# Patient Record
Sex: Female | Born: 1945
Health system: Southern US, Community
[De-identification: ages and names within clinical notes are randomized; demographics above are authoritative.]

## PROBLEM LIST (undated history)

## (undated) DIAGNOSIS — F32A Depression, unspecified: Secondary | ICD-10-CM

## (undated) DIAGNOSIS — D693 Immune thrombocytopenic purpura: Principal | ICD-10-CM

## (undated) DIAGNOSIS — I6782 Cerebral ischemia: Secondary | ICD-10-CM

## (undated) DIAGNOSIS — K219 Gastro-esophageal reflux disease without esophagitis: Secondary | ICD-10-CM

## (undated) DIAGNOSIS — M069 Rheumatoid arthritis, unspecified: Secondary | ICD-10-CM

## (undated) DIAGNOSIS — F329 Major depressive disorder, single episode, unspecified: Secondary | ICD-10-CM

## (undated) DIAGNOSIS — G4700 Insomnia, unspecified: Secondary | ICD-10-CM

## (undated) DIAGNOSIS — M1990 Unspecified osteoarthritis, unspecified site: Secondary | ICD-10-CM

## (undated) DIAGNOSIS — Z8489 Family history of other specified conditions: Secondary | ICD-10-CM

## (undated) DIAGNOSIS — R112 Nausea with vomiting, unspecified: Secondary | ICD-10-CM

## (undated) DIAGNOSIS — T8859XA Other complications of anesthesia, initial encounter: Secondary | ICD-10-CM

## (undated) DIAGNOSIS — D759 Disease of blood and blood-forming organs, unspecified: Secondary | ICD-10-CM

## (undated) DIAGNOSIS — M810 Age-related osteoporosis without current pathological fracture: Secondary | ICD-10-CM

## (undated) DIAGNOSIS — C801 Malignant (primary) neoplasm, unspecified: Secondary | ICD-10-CM

## (undated) DIAGNOSIS — Z9889 Other specified postprocedural states: Secondary | ICD-10-CM

## (undated) DIAGNOSIS — F419 Anxiety disorder, unspecified: Secondary | ICD-10-CM

## (undated) DIAGNOSIS — D696 Thrombocytopenia, unspecified: Secondary | ICD-10-CM

## (undated) HISTORY — PX: MOHS SURGERY: SUR867

## (undated) HISTORY — DX: Rheumatoid arthritis, unspecified: M06.9

## (undated) HISTORY — DX: Depression, unspecified: F32.A

## (undated) HISTORY — DX: Insomnia, unspecified: G47.00

## (undated) HISTORY — PX: BREAST SURGERY: SHX581

## (undated) HISTORY — PX: CHOLECYSTECTOMY: SHX55

## (undated) HISTORY — DX: Age-related osteoporosis without current pathological fracture: M81.0

## (undated) HISTORY — PX: TUBAL LIGATION: SHX77

## (undated) HISTORY — DX: Major depressive disorder, single episode, unspecified: F32.9

## (undated) HISTORY — DX: Unspecified osteoarthritis, unspecified site: M19.90

## (undated) HISTORY — PX: ABDOMINAL HYSTERECTOMY: SHX81

## (undated) HISTORY — DX: Immune thrombocytopenic purpura: D69.3

---

## 1999-04-24 ENCOUNTER — Ambulatory Visit (HOSPITAL_COMMUNITY): Admission: RE | Admit: 1999-04-24 | Discharge: 1999-04-24 | Payer: Self-pay | Admitting: Gastroenterology

## 2004-02-28 ENCOUNTER — Encounter: Admission: RE | Admit: 2004-02-28 | Discharge: 2004-02-28 | Payer: Self-pay | Admitting: Orthopedic Surgery

## 2004-11-06 ENCOUNTER — Ambulatory Visit: Payer: Self-pay | Admitting: Family Medicine

## 2005-01-13 ENCOUNTER — Ambulatory Visit: Payer: Self-pay | Admitting: Hematology & Oncology

## 2005-01-23 ENCOUNTER — Ambulatory Visit: Payer: Self-pay | Admitting: Family Medicine

## 2005-04-22 ENCOUNTER — Ambulatory Visit: Payer: Self-pay | Admitting: Family Medicine

## 2006-01-12 ENCOUNTER — Ambulatory Visit: Payer: Self-pay | Admitting: Hematology & Oncology

## 2006-03-13 ENCOUNTER — Encounter: Admission: RE | Admit: 2006-03-13 | Discharge: 2006-03-13 | Payer: Self-pay | Admitting: Rheumatology

## 2007-01-06 ENCOUNTER — Ambulatory Visit: Payer: Self-pay | Admitting: Hematology & Oncology

## 2007-01-08 LAB — CBC WITH DIFFERENTIAL/PLATELET
BASO%: 0.4 % (ref 0.0–2.0)
Basophils Absolute: 0 10*3/uL (ref 0.0–0.1)
EOS%: 1.1 % (ref 0.0–7.0)
Eosinophils Absolute: 0.1 10*3/uL (ref 0.0–0.5)
HCT: 42.6 % (ref 34.8–46.6)
HGB: 15.1 g/dL (ref 11.6–15.9)
LYMPH%: 27.1 % (ref 14.0–48.0)
MCH: 33.4 pg (ref 26.0–34.0)
MCHC: 35.6 g/dL (ref 32.0–36.0)
MCV: 93.9 fL (ref 81.0–101.0)
MONO#: 0.5 10*3/uL (ref 0.1–0.9)
MONO%: 7.7 % (ref 0.0–13.0)
NEUT#: 4.2 10*3/uL (ref 1.5–6.5)
NEUT%: 63.7 % (ref 39.6–76.8)
Platelets: 136 10*3/uL — ABNORMAL LOW (ref 145–400)
RBC: 4.54 10*6/uL (ref 3.70–5.32)
RDW: 12.1 % (ref 11.3–14.5)
WBC: 6.7 10*3/uL (ref 3.9–10.0)
lymph#: 1.8 10*3/uL (ref 0.9–3.3)

## 2007-01-08 LAB — CHCC SMEAR

## 2008-01-06 ENCOUNTER — Ambulatory Visit: Payer: Self-pay | Admitting: Hematology & Oncology

## 2008-02-02 LAB — CBC WITH DIFFERENTIAL (CANCER CENTER ONLY)
BASO#: 0 10*3/uL (ref 0.0–0.2)
BASO%: 0.4 % (ref 0.0–2.0)
EOS%: 1.8 % (ref 0.0–7.0)
Eosinophils Absolute: 0.1 10*3/uL (ref 0.0–0.5)
HCT: 42 % (ref 34.8–46.6)
HGB: 14.5 g/dL (ref 11.6–15.9)
LYMPH#: 1.2 10*3/uL (ref 0.9–3.3)
LYMPH%: 18.6 % (ref 14.0–48.0)
MCH: 33.9 pg (ref 26.0–34.0)
MCHC: 34.4 g/dL (ref 32.0–36.0)
MCV: 98 fL (ref 81–101)
MONO#: 0.5 10*3/uL (ref 0.1–0.9)
MONO%: 7.2 % (ref 0.0–13.0)
NEUT#: 4.8 10*3/uL (ref 1.5–6.5)
NEUT%: 72 % (ref 39.6–80.0)
Platelets: 140 10*3/uL — ABNORMAL LOW (ref 145–400)
RBC: 4.27 10*6/uL (ref 3.70–5.32)
RDW: 11.5 % (ref 10.5–14.6)
WBC: 6.7 10*3/uL (ref 3.9–10.0)

## 2008-02-02 LAB — CHCC SATELLITE - SMEAR

## 2009-01-30 ENCOUNTER — Ambulatory Visit: Payer: Self-pay | Admitting: Hematology & Oncology

## 2009-01-31 LAB — CBC WITH DIFFERENTIAL (CANCER CENTER ONLY)
BASO#: 0 10*3/uL (ref 0.0–0.2)
BASO%: 0.4 % (ref 0.0–2.0)
EOS%: 2 % (ref 0.0–7.0)
Eosinophils Absolute: 0.1 10*3/uL (ref 0.0–0.5)
HCT: 43.1 % (ref 34.8–46.6)
HGB: 15 g/dL (ref 11.6–15.9)
LYMPH#: 1.2 10*3/uL (ref 0.9–3.3)
LYMPH%: 23.9 % (ref 14.0–48.0)
MCH: 34.1 pg — ABNORMAL HIGH (ref 26.0–34.0)
MCHC: 34.9 g/dL (ref 32.0–36.0)
MCV: 98 fL (ref 81–101)
MONO#: 0.4 10*3/uL (ref 0.1–0.9)
MONO%: 6.7 % (ref 0.0–13.0)
NEUT#: 3.5 10*3/uL (ref 1.5–6.5)
NEUT%: 67 % (ref 39.6–80.0)
Platelets: 122 10*3/uL — ABNORMAL LOW (ref 145–400)
RBC: 4.41 10*6/uL (ref 3.70–5.32)
RDW: 11 % (ref 10.5–14.6)
WBC: 5.2 10*3/uL (ref 3.9–10.0)

## 2009-01-31 LAB — CHCC SATELLITE - SMEAR

## 2010-01-25 ENCOUNTER — Ambulatory Visit: Payer: Self-pay | Admitting: Hematology & Oncology

## 2010-01-30 LAB — CBC WITH DIFFERENTIAL (CANCER CENTER ONLY)
BASO#: 0 10*3/uL (ref 0.0–0.2)
BASO%: 0.4 % (ref 0.0–2.0)
EOS%: 2.1 % (ref 0.0–7.0)
Eosinophils Absolute: 0.1 10*3/uL (ref 0.0–0.5)
HCT: 42.1 % (ref 34.8–46.6)
HGB: 14.3 g/dL (ref 11.6–15.9)
LYMPH#: 1 10*3/uL (ref 0.9–3.3)
LYMPH%: 20.3 % (ref 14.0–48.0)
MCH: 33.7 pg (ref 26.0–34.0)
MCHC: 33.9 g/dL (ref 32.0–36.0)
MCV: 99 fL (ref 81–101)
MONO#: 0.5 10*3/uL (ref 0.1–0.9)
MONO%: 9.9 % (ref 0.0–13.0)
NEUT#: 3.2 10*3/uL (ref 1.5–6.5)
NEUT%: 67.3 % (ref 39.6–80.0)
Platelets: 143 10*3/uL — ABNORMAL LOW (ref 145–400)
RBC: 4.24 10*6/uL (ref 3.70–5.32)
RDW: 10.3 % — ABNORMAL LOW (ref 10.5–14.6)
WBC: 4.8 10*3/uL (ref 3.9–10.0)

## 2010-01-30 LAB — CHCC SATELLITE - SMEAR

## 2010-01-31 LAB — VITAMIN D 25 HYDROXY (VIT D DEFICIENCY, FRACTURES): Vit D, 25-Hydroxy: 38 ng/mL (ref 30–89)

## 2011-01-07 ENCOUNTER — Encounter: Payer: Self-pay | Admitting: *Deleted

## 2011-02-18 ENCOUNTER — Encounter: Payer: Self-pay | Admitting: Hematology & Oncology

## 2011-02-24 ENCOUNTER — Other Ambulatory Visit: Payer: Self-pay | Admitting: Hematology & Oncology

## 2011-02-24 ENCOUNTER — Other Ambulatory Visit (HOSPITAL_BASED_OUTPATIENT_CLINIC_OR_DEPARTMENT_OTHER): Payer: 59 | Admitting: Lab

## 2011-02-24 ENCOUNTER — Ambulatory Visit (HOSPITAL_BASED_OUTPATIENT_CLINIC_OR_DEPARTMENT_OTHER): Payer: 59 | Admitting: Hematology & Oncology

## 2011-02-24 ENCOUNTER — Encounter: Payer: Self-pay | Admitting: Hematology & Oncology

## 2011-02-24 VITALS — BP 128/81 | HR 63 | Temp 98.1°F | Ht 62.25 in | Wt 153.0 lb

## 2011-02-24 DIAGNOSIS — D649 Anemia, unspecified: Secondary | ICD-10-CM

## 2011-02-24 DIAGNOSIS — D693 Immune thrombocytopenic purpura: Secondary | ICD-10-CM

## 2011-02-24 HISTORY — DX: Immune thrombocytopenic purpura: D69.3

## 2011-02-24 LAB — CBC WITH DIFFERENTIAL (CANCER CENTER ONLY)
BASO#: 0 10*3/uL (ref 0.0–0.2)
BASO%: 0.2 % (ref 0.0–2.0)
EOS%: 1.3 % (ref 0.0–7.0)
Eosinophils Absolute: 0.1 10*3/uL (ref 0.0–0.5)
HCT: 41.8 % (ref 34.8–46.6)
HGB: 14.8 g/dL (ref 11.6–15.9)
LYMPH%: 26.5 % (ref 14.0–48.0)
MCH: 33.6 pg (ref 26.0–34.0)
MCHC: 35.4 g/dL (ref 32.0–36.0)
MCV: 95 fL (ref 81–101)
MONO#: 0.5 10*3/uL (ref 0.1–0.9)
MONO%: 9.7 % (ref 0.0–13.0)
NEUT#: 3.3 10*3/uL (ref 1.5–6.5)
NEUT%: 62.3 % (ref 39.6–80.0)
Platelets: 125 10*3/uL — ABNORMAL LOW (ref 145–400)
RBC: 4.41 10*6/uL (ref 3.70–5.32)
RDW: 12.7 % (ref 11.1–15.7)

## 2011-02-24 LAB — RETICULOCYTES (CHCC)
RBC.: 4.43 MIL/uL (ref 3.87–5.11)
Retic Ct Pct: 1.2 % (ref 0.4–2.3)

## 2011-02-24 NOTE — Progress Notes (Signed)
This office note has been dictated.

## 2011-02-25 NOTE — Progress Notes (Signed)
CC:   Gilford Rile. Benedetto Goad, M.D.  DIAGNOSIS:  Chronic immune thrombocytopenia.  CURRENT THERAPY:  Observation.  INTERIM HISTORY:  Joy Fernandez comes in for followup.  We see her yearly. She is doing okay.  Her rheumatoid arthritis has not been too bad for her.  She has been on methotrexate for this.  She has also been on, I think she is taking Humira for this.  She is still working.  She may be thinking about retiring next year.  She has had no bruising or bleeding.  She has had no change in bowel or bladder habits.  She has had no cough or shortness of breath.  Her last mammogram was earlier this year.  PHYSICAL EXAMINATION:  This is a well developed, well-nourished white female in no obvious distress.  Vital signs:  Temperature 98, pulse 63, respiratory rate 20, blood pressure 128/81.  Weight is 153.  Head and neck:  Shows a normocephalic, atraumatic skull.  There are no ocular or oral lesions.  There are no palpable cervical or supraclavicular lymph nodes.  Lungs:  Clear bilaterally.  Cardiac:  Regular rate and rhythm with a normal S1, S2.  There are no murmurs or rubs, or bruits. Abdomen:  Soft with good bowel sounds.  No palpable abdominal mass.  No palpable hepatosplenomegaly.  Extremities:  No clubbing, cyanosis or edema.  She does have some rheumatoid arthritic changes in her joints. Skin:  No rashes, ecchymosis or petechia.  Neurologic:  No focal neurological deficits.  LABORATORY STUDIES:  White cell count is 5.3, hemoglobin 14.8, hematocrit 41.8, platelet count 125.  MCV is 95.  IMPRESSION:  Joy Fernandez is a 65 year old white female with thrombocytopenia.  We have been following her now for several years. Her platelet count tends to fluctuate.  It is not low enough that it would cause her any issues.  It is not low enough that it would cause her to change her medications.  Her quality of life seems to be doing pretty well with rheumatoid arthritis protocol.  For now, we  will plan to get her back in another year.  I do not see that we need any blood work in between visits.   ______________________________ Josph Macho, M.D. PRE/MEDQ  D:  02/24/2011  T:  02/25/2011  Job:  [id number]

## 2011-06-06 ENCOUNTER — Other Ambulatory Visit: Payer: Self-pay

## 2011-06-06 ENCOUNTER — Telehealth: Payer: Self-pay | Admitting: Hematology & Oncology

## 2011-06-06 NOTE — Telephone Encounter (Signed)
Left pt message with 4-11 appointment from referral

## 2011-07-10 ENCOUNTER — Ambulatory Visit (HOSPITAL_BASED_OUTPATIENT_CLINIC_OR_DEPARTMENT_OTHER): Payer: 59 | Admitting: Hematology & Oncology

## 2011-07-10 ENCOUNTER — Other Ambulatory Visit (HOSPITAL_BASED_OUTPATIENT_CLINIC_OR_DEPARTMENT_OTHER): Payer: 59 | Admitting: Lab

## 2011-07-10 VITALS — BP 124/73 | HR 61 | Temp 97.0°F | Ht 62.0 in | Wt 153.0 lb

## 2011-07-10 DIAGNOSIS — M069 Rheumatoid arthritis, unspecified: Secondary | ICD-10-CM

## 2011-07-10 DIAGNOSIS — D693 Immune thrombocytopenic purpura: Secondary | ICD-10-CM

## 2011-07-10 LAB — CBC WITH DIFFERENTIAL (CANCER CENTER ONLY)
BASO#: 0 10*3/uL (ref 0.0–0.2)
BASO%: 0.2 % (ref 0.0–2.0)
EOS%: 1.3 % (ref 0.0–7.0)
Eosinophils Absolute: 0.1 10*3/uL (ref 0.0–0.5)
HGB: 14.9 g/dL (ref 11.6–15.9)
LYMPH%: 25.3 % (ref 14.0–48.0)
MCH: 34.1 pg — ABNORMAL HIGH (ref 26.0–34.0)
MCHC: 35.2 g/dL (ref 32.0–36.0)
MCV: 97 fL (ref 81–101)
MONO%: 9.3 % (ref 0.0–13.0)
Platelets: 123 10*3/uL — ABNORMAL LOW (ref 145–400)
RBC: 4.37 10*6/uL (ref 3.70–5.32)
RDW: 12.9 % (ref 11.1–15.7)

## 2011-07-10 NOTE — Progress Notes (Signed)
This office note has been dictated.

## 2011-07-11 NOTE — Progress Notes (Signed)
CC:   Gilford Rile. Benedetto Goad, M.D.  DIAGNOSIS:  Chronic immune thrombocytopenia, stable.  CURRENT THERAPY:  Observation.  INTERIM HISTORY:  Ms. Danielsen comes in for her followup.  We just see her yearly.  Apparently she was seen by her family doctor recently.  They had blood work done.  The family doctor felt that there were some issues that need to be addressed by [place].  As such, we are seeing her a little bit early.  She does have rheumatoid arthritis.  She said this has not been bothering her all that much.  She is still working without complications.  She is on methotrexate which she takes weekly.  She was having some issues with pain in the right lower quadrant.  She says she had an ultrasound done, which I suspect was unremarkable.  She had blood work done, which led to her being referred back to [place].  She did also have a CT scan done recently.  This did not show anything that was remarkable.  She had a normal appendix.  There was no evidence of any inflammatory process.  She has had no problems with bleeding.  There has been no bruising.  She does state that her bowel movements are a little bit more hard.  She does have some bright red blood per rectum.  She thinks this is hemorrhoidal related.  She had a colonoscopy a year ago.  PHYSICAL EXAMINATION:  General:  This is a well-developed, well- nourished white female in no obvious distress.  Vital Signs: Temperature 97, pulse 61, respiratory rate 18, blood pressure 124/73, weight is 153.  Head and Neck Exam:  Shows a normocephalic, atraumatic skull.  There are no ocular or oral lesions.  There are no palpable cervical or supraclavicular lymph nodes.  Lungs:  Clear to percussion and auscultation bilaterally.  Cardiac Exam:  Regular rate and rhythm with a normal S1 and S2.  There are no murmurs, rubs, or bruits. Abdominal Exam:  Soft with good bowel sounds.  There is no palpable abdominal mass.  There is no fluid wave.  There is no  guarding or rebound tenderness.  There is no palpable hepatosplenomegaly. Extremities:  Show no clubbing, cyanosis, or edema.  Neurological Exam: Shows no focal neurological deficits.  Skin Exam:  No rashes, ecchymoses, or petechiae.  LABORATORY STUDIES:  Show a white cell count of 6.1, hemoglobin 14.9, hematocrit 42.3, platelet count 123.  MCV is 97.  IMPRESSION:  Ms. Chabot is a 66 year old white female.  We have been following her now for almost 10 years.  Back in 2004, her platelet count was 118,000.  I did look at her blood smear.  I did not see anything that was unusual. Her platelets were well-granulated.  She had a few large platelets.  I think we can probably get her back yearly.  This would be reasonable followup, as she is stable from my point of view.    ______________________________ Josph Macho, M.D. PRE/MEDQ  D:  07/10/2011  T:  07/11/2011  Job:  [id number]

## 2012-02-23 ENCOUNTER — Other Ambulatory Visit (HOSPITAL_BASED_OUTPATIENT_CLINIC_OR_DEPARTMENT_OTHER): Payer: 59 | Admitting: Lab

## 2012-02-23 ENCOUNTER — Ambulatory Visit (HOSPITAL_BASED_OUTPATIENT_CLINIC_OR_DEPARTMENT_OTHER): Payer: 59 | Admitting: Medical

## 2012-02-23 VITALS — BP 127/63 | HR 68 | Temp 98.4°F | Resp 16 | Ht 62.0 in | Wt 149.0 lb

## 2012-02-23 DIAGNOSIS — D693 Immune thrombocytopenic purpura: Secondary | ICD-10-CM

## 2012-02-23 DIAGNOSIS — M069 Rheumatoid arthritis, unspecified: Secondary | ICD-10-CM

## 2012-02-23 LAB — CBC WITH DIFFERENTIAL (CANCER CENTER ONLY)
BASO#: 0 10*3/uL (ref 0.0–0.2)
BASO%: 0.3 % (ref 0.0–2.0)
EOS%: 0.8 % (ref 0.0–7.0)
Eosinophils Absolute: 0.1 10*3/uL (ref 0.0–0.5)
HCT: 45.9 % (ref 34.8–46.6)
HGB: 15.7 g/dL (ref 11.6–15.9)
LYMPH#: 1.2 10*3/uL (ref 0.9–3.3)
LYMPH%: 17.9 % (ref 14.0–48.0)
MCH: 34.1 pg — ABNORMAL HIGH (ref 26.0–34.0)
MCHC: 34.2 g/dL (ref 32.0–36.0)
MCV: 100 fL (ref 81–101)
MONO#: 0.6 10*3/uL (ref 0.1–0.9)
MONO%: 8.4 % (ref 0.0–13.0)
NEUT#: 4.8 10*3/uL (ref 1.5–6.5)
NEUT%: 72.6 % (ref 39.6–80.0)
Platelets: 132 10*3/uL — ABNORMAL LOW (ref 145–400)
RBC: 4.61 10*6/uL (ref 3.70–5.32)
RDW: 13.5 % (ref 11.1–15.7)
WBC: 6.6 10*3/uL (ref 3.9–10.0)

## 2012-02-23 NOTE — Progress Notes (Signed)
Diagnosis: Chronic immune thrombocytopenia, stable.  Current therapy: Observation.  Interim history: Joy Fernandez comes in today for an office followup visit.  Overall, she, reports, that she's doing well.  She does have rheumatoid arthritis and is on methotrexate for this.  She still continues to work.  She may be thinking about retiring soon.  She does not report any obvious, or abnormal bleeding or bruising.  She has a good appetite.  She denies any unintentional weight loss.  She denies any nausea, vomiting, diarrhea, constipation.  She denies any fevers, chills, night sweats, any palpable and not be.  She denies any cough, chest pain, or shortness of breath.  She denies any headaches, visual changes, or rashes.  She denies any lower leg swelling.  We will continue to see Joy Fernandez on a yearly basis.  Review of Systems: Constitutional:Negative for malaise/fatigue, fever, chills, weight loss, diaphoresis, activity change, appetite change, and unexpected weight change.  HEENT: Negative for double vision, blurred vision, visual loss, ear pain, tinnitus, congestion, rhinorrhea, epistaxis sore throat or sinus disease, oral pain/lesion, tongue soreness Respiratory: Negative for cough, chest tightness, shortness of breath, wheezing and stridor.  Cardiovascular: Negative for chest pain, palpitations, leg swelling, orthopnea, PND, DOE or claudication Gastrointestinal: Negative for nausea, vomiting, abdominal pain, diarrhea, constipation, blood in stool, melena, hematochezia, abdominal distention, anal bleeding, rectal pain, anorexia and hematemesis.  Genitourinary: Negative for dysuria, frequency, hematuria,  Musculoskeletal: Negative for myalgias, back pain, joint swelling, arthralgias and gait problem.  Skin: Negative for rash, color change, pallor and wound.  Neurological:. Negative for dizziness/light-headedness, tremors, seizures, syncope, facial asymmetry, speech difficulty, weakness, numbness, headaches  and paresthesias.  Hematological: Negative for adenopathy. Does not bruise/bleed easily.  Psychiatric/Behavioral:  Negative for depression, no loss of interest in normal activity or change in sleep pattern.   Physical Exam: This is a 66 year old, well-developed, well-nourished, white female, in no obvious distress Vitals: Temperature 90.4 degrees, pulse 68, respirations 16, blood pressure 127/63, weight 149 pounds HEENT reveals a normocephalic, atraumatic skull, no scleral icterus, no oral lesions  Neck is supple without any cervical or supraclavicular adenopathy.  Lungs are clear to auscultation bilaterally. There are no wheezes, rales or rhonci Cardiac is regular rate and rhythm with a normal S1 and S2. There are no murmurs, rubs, or bruits.  Abdomen is soft with good bowel sounds, there is no palpable mass. There is no palpable hepatosplenomegaly. There is no palpable fluid wave.  Musculoskeletal no tenderness of the spine, ribs, or hips.  Extremities there are no clubbing, cyanosis, or edema.  Skin no petechia, purpura or ecchymosis Neurologic is nonfocal.  Laboratory Data: White count 6.6, hemoglobin 15.7, hematocrit 45.9, platelets 132,000  Current Outpatient Prescriptions on File Prior to Visit  Medication Sig Dispense Refill  . aspirin 81 MG tablet Take 81 mg by mouth daily.        . Cholecalciferol (VITAMIN D-3) 5000 UNITS TABS Take by mouth daily.        Marland Kitchen docusate sodium (COLACE) 100 MG capsule Take 100 mg by mouth every morning.      . escitalopram (LEXAPRO) 10 MG tablet Take 10 mg by mouth daily.        Marland Kitchen esomeprazole (NEXIUM) 40 MG capsule Take 40 mg by mouth daily before breakfast.        . folic acid (FOLVITE) 1 MG tablet Take 1 mg by mouth 2 (two) times daily.       . methotrexate (RHEUMATREX) 2.5 MG tablet Take 17.5  mg by mouth once a week.       . Milk Thstle-Artchke-Dand-Licor (SUPER MILK THISTLE X PO) Take by mouth daily.        Marland Kitchen zolpidem (AMBIEN CR) 6.25 MG CR  tablet Take 6.25 mg by mouth at bedtime as needed.        Assessment/Plan: This is a pleasant, 66 year old, white female, with the following issues:  #1 .  Chronic immune thrombocytopenia.  Her platelet count.  Still remains stable.  We have been following her now for almost 10 years.  The plan is to continue to see her on a yearly basis unless any issue arises.  #2.  Followup.  We will follow back up with Joy Fernandez on one year, but before then should there be questions or concerns.

## 2013-02-21 ENCOUNTER — Ambulatory Visit (HOSPITAL_BASED_OUTPATIENT_CLINIC_OR_DEPARTMENT_OTHER): Payer: Medicare Other | Admitting: Hematology & Oncology

## 2013-02-21 ENCOUNTER — Other Ambulatory Visit (HOSPITAL_BASED_OUTPATIENT_CLINIC_OR_DEPARTMENT_OTHER): Payer: Medicare Other | Admitting: Lab

## 2013-02-21 VITALS — BP 134/50 | HR 64 | Temp 97.9°F | Resp 14 | Ht 62.0 in | Wt 151.0 lb

## 2013-02-21 DIAGNOSIS — M069 Rheumatoid arthritis, unspecified: Secondary | ICD-10-CM

## 2013-02-21 DIAGNOSIS — D693 Immune thrombocytopenic purpura: Secondary | ICD-10-CM

## 2013-02-21 LAB — CBC WITH DIFFERENTIAL (CANCER CENTER ONLY)
BASO%: 0.6 % (ref 0.0–2.0)
Eosinophils Absolute: 0.1 10*3/uL (ref 0.0–0.5)
HCT: 41.8 % (ref 34.8–46.6)
LYMPH%: 26.9 % (ref 14.0–48.0)
MCH: 32.6 pg (ref 26.0–34.0)
MCV: 96 fL (ref 81–101)
MONO#: 0.4 10*3/uL (ref 0.1–0.9)
MONO%: 8.5 % (ref 0.0–13.0)
NEUT%: 62.3 % (ref 39.6–80.0)
RBC: 4.35 10*6/uL (ref 3.70–5.32)
RDW: 13.2 % (ref 11.1–15.7)
WBC: 4.8 10*3/uL (ref 3.9–10.0)

## 2013-02-21 LAB — CHCC SATELLITE - SMEAR

## 2013-02-21 NOTE — Progress Notes (Signed)
This office note has been dictated.

## 2013-03-05 NOTE — Progress Notes (Signed)
CC:   Philemon Kingdom, MD  DIAGNOSES: 1. Chronic immune thrombocytopenia. 2. Rheumatoid arthritis.  CURRENT THERAPY:  Observation.  INTERIM HISTORY:  Joy Fernandez comes in for her followup. Previously, we saw her a year ago. She is really bothered by the rheumatoid arthritis. Overall, she has actually done pretty well with the rheumatoid arthritis. She is on methotrexate. Her rheumatologist wants to put her on, I think, one of the monoclonal antibodies. She is worried about this.  She has had no problems with bleeding. She has had some joint issues, but really no deformities that I can see from the rheumatism.  She has had no fever. There has been no cough. She has had no nausea or vomiting. There has been no change in bowel or bladder habits.  PHYSICAL EXAMINATION:  General: This is a well-developed, well-nourished white female in no obvious distress. Vital Signs: Temperature of 97.9, pulse 64, respiratory rate 14, blood pressure 134/50, weight is 151 pounds. Head and Neck: Normocephalic, atraumatic skull. There are no ocular or oral lesions. She has no palpable cervical or supraclavicular lymph nodes. Lungs:  Clear bilaterally. Cardiac: Regular rate and rhythm with a normal S1, S2.  There are no murmurs, rubs, or bruits. Abdomen: Soft. She has good bowel sounds. There is no fluid wave. There is no palpable abdominal mass. There is no palpable hepatosplenomegaly. Extremities: Some mild joint changes with rheumatism. She has no joint deformities. She has good range of motion of her joints. She has good muscle strength. Skin: No rashes, ecchymosis, or petechia.  LABORATORY STUDIES:  White cell count is 4.8, hemoglobin 14.2, hematocrit 41.8, platelet count 146. MCV is 96.  IMPRESSION:  Joy Fernandez is a very charming 67 year old white female with thrombocytopenia. This is chronic immune thrombocytopenia. We have been following her for several years with this.  From my point of view, I  really do not think that we need to get her back for a visit now. Her platelet count is holding steady. I just do not see that we are really adding much to her overall medical care.  I have told her that we could always see her back if she has any issues in the future.  I will certainly miss [name]. We have been with her for a long time. She has done very, very well from a blood point of view.    ______________________________ Josph Macho, M.D. PRE/MEDQ  D:  02/21/2013  T:  03/04/2013  Job:  [id number]

## 2014-05-30 DIAGNOSIS — N939 Abnormal uterine and vaginal bleeding, unspecified: Secondary | ICD-10-CM

## 2014-05-30 HISTORY — DX: Abnormal uterine and vaginal bleeding, unspecified: N93.9

## 2014-10-05 DIAGNOSIS — N952 Postmenopausal atrophic vaginitis: Secondary | ICD-10-CM | POA: Insufficient documentation

## 2015-06-25 DIAGNOSIS — J069 Acute upper respiratory infection, unspecified: Secondary | ICD-10-CM | POA: Diagnosis not present

## 2015-06-25 DIAGNOSIS — R509 Fever, unspecified: Secondary | ICD-10-CM | POA: Diagnosis not present

## 2015-07-16 DIAGNOSIS — M19271 Secondary osteoarthritis, right ankle and foot: Secondary | ICD-10-CM | POA: Diagnosis not present

## 2015-07-16 DIAGNOSIS — M19241 Secondary osteoarthritis, right hand: Secondary | ICD-10-CM | POA: Diagnosis not present

## 2015-07-16 DIAGNOSIS — M0579 Rheumatoid arthritis with rheumatoid factor of multiple sites without organ or systems involvement: Secondary | ICD-10-CM | POA: Diagnosis not present

## 2015-07-16 DIAGNOSIS — Z09 Encounter for follow-up examination after completed treatment for conditions other than malignant neoplasm: Secondary | ICD-10-CM | POA: Diagnosis not present

## 2015-07-24 DIAGNOSIS — F321 Major depressive disorder, single episode, moderate: Secondary | ICD-10-CM | POA: Diagnosis not present

## 2015-07-24 DIAGNOSIS — D171 Benign lipomatous neoplasm of skin and subcutaneous tissue of trunk: Secondary | ICD-10-CM | POA: Diagnosis not present

## 2015-07-24 DIAGNOSIS — Z13220 Encounter for screening for lipoid disorders: Secondary | ICD-10-CM | POA: Diagnosis not present

## 2015-07-24 DIAGNOSIS — Z0001 Encounter for general adult medical examination with abnormal findings: Secondary | ICD-10-CM | POA: Diagnosis not present

## 2015-07-24 DIAGNOSIS — K219 Gastro-esophageal reflux disease without esophagitis: Secondary | ICD-10-CM | POA: Diagnosis not present

## 2015-07-24 DIAGNOSIS — N952 Postmenopausal atrophic vaginitis: Secondary | ICD-10-CM | POA: Diagnosis not present

## 2015-07-24 DIAGNOSIS — R079 Chest pain, unspecified: Secondary | ICD-10-CM | POA: Diagnosis not present

## 2015-07-24 DIAGNOSIS — Z79899 Other long term (current) drug therapy: Secondary | ICD-10-CM | POA: Diagnosis not present

## 2015-07-24 DIAGNOSIS — M810 Age-related osteoporosis without current pathological fracture: Secondary | ICD-10-CM | POA: Diagnosis not present

## 2015-07-25 DIAGNOSIS — R079 Chest pain, unspecified: Secondary | ICD-10-CM | POA: Diagnosis not present

## 2015-08-01 DIAGNOSIS — D171 Benign lipomatous neoplasm of skin and subcutaneous tissue of trunk: Secondary | ICD-10-CM | POA: Insufficient documentation

## 2015-08-01 HISTORY — DX: Benign lipomatous neoplasm of skin and subcutaneous tissue of trunk: D17.1

## 2015-08-08 DIAGNOSIS — R079 Chest pain, unspecified: Secondary | ICD-10-CM | POA: Diagnosis not present

## 2015-08-20 DIAGNOSIS — H2513 Age-related nuclear cataract, bilateral: Secondary | ICD-10-CM | POA: Diagnosis not present

## 2015-08-22 DIAGNOSIS — D171 Benign lipomatous neoplasm of skin and subcutaneous tissue of trunk: Secondary | ICD-10-CM | POA: Diagnosis not present

## 2015-09-03 DIAGNOSIS — F419 Anxiety disorder, unspecified: Secondary | ICD-10-CM | POA: Diagnosis not present

## 2015-09-03 DIAGNOSIS — G4700 Insomnia, unspecified: Secondary | ICD-10-CM | POA: Diagnosis not present

## 2015-09-03 DIAGNOSIS — Z9049 Acquired absence of other specified parts of digestive tract: Secondary | ICD-10-CM | POA: Diagnosis not present

## 2015-09-03 DIAGNOSIS — Z79899 Other long term (current) drug therapy: Secondary | ICD-10-CM | POA: Diagnosis not present

## 2015-09-03 DIAGNOSIS — M069 Rheumatoid arthritis, unspecified: Secondary | ICD-10-CM | POA: Diagnosis not present

## 2015-09-03 DIAGNOSIS — K219 Gastro-esophageal reflux disease without esophagitis: Secondary | ICD-10-CM | POA: Diagnosis not present

## 2015-09-03 DIAGNOSIS — F329 Major depressive disorder, single episode, unspecified: Secondary | ICD-10-CM | POA: Diagnosis not present

## 2015-09-03 DIAGNOSIS — D171 Benign lipomatous neoplasm of skin and subcutaneous tissue of trunk: Secondary | ICD-10-CM | POA: Diagnosis not present

## 2015-09-03 DIAGNOSIS — M810 Age-related osteoporosis without current pathological fracture: Secondary | ICD-10-CM | POA: Diagnosis not present

## 2015-09-03 DIAGNOSIS — E785 Hyperlipidemia, unspecified: Secondary | ICD-10-CM | POA: Diagnosis not present

## 2015-09-11 DIAGNOSIS — M810 Age-related osteoporosis without current pathological fracture: Secondary | ICD-10-CM | POA: Diagnosis not present

## 2015-09-11 DIAGNOSIS — F419 Anxiety disorder, unspecified: Secondary | ICD-10-CM | POA: Diagnosis not present

## 2015-09-11 DIAGNOSIS — M059 Rheumatoid arthritis with rheumatoid factor, unspecified: Secondary | ICD-10-CM | POA: Diagnosis not present

## 2015-09-11 DIAGNOSIS — F321 Major depressive disorder, single episode, moderate: Secondary | ICD-10-CM | POA: Diagnosis not present

## 2015-09-18 DIAGNOSIS — L700 Acne vulgaris: Secondary | ICD-10-CM | POA: Diagnosis not present

## 2015-09-18 DIAGNOSIS — D224 Melanocytic nevi of scalp and neck: Secondary | ICD-10-CM | POA: Diagnosis not present

## 2015-09-18 DIAGNOSIS — D2261 Melanocytic nevi of right upper limb, including shoulder: Secondary | ICD-10-CM | POA: Diagnosis not present

## 2015-09-18 DIAGNOSIS — Z85828 Personal history of other malignant neoplasm of skin: Secondary | ICD-10-CM | POA: Diagnosis not present

## 2015-09-19 DIAGNOSIS — Z09 Encounter for follow-up examination after completed treatment for conditions other than malignant neoplasm: Secondary | ICD-10-CM

## 2015-09-19 HISTORY — DX: Encounter for follow-up examination after completed treatment for conditions other than malignant neoplasm: Z09

## 2015-09-25 DIAGNOSIS — Z79899 Other long term (current) drug therapy: Secondary | ICD-10-CM | POA: Diagnosis not present

## 2015-12-17 DIAGNOSIS — M0609 Rheumatoid arthritis without rheumatoid factor, multiple sites: Secondary | ICD-10-CM | POA: Diagnosis not present

## 2015-12-17 DIAGNOSIS — Z09 Encounter for follow-up examination after completed treatment for conditions other than malignant neoplasm: Secondary | ICD-10-CM | POA: Diagnosis not present

## 2015-12-17 DIAGNOSIS — Z79899 Other long term (current) drug therapy: Secondary | ICD-10-CM | POA: Diagnosis not present

## 2015-12-17 DIAGNOSIS — M19271 Secondary osteoarthritis, right ankle and foot: Secondary | ICD-10-CM | POA: Diagnosis not present

## 2015-12-17 DIAGNOSIS — M19241 Secondary osteoarthritis, right hand: Secondary | ICD-10-CM | POA: Diagnosis not present

## 2015-12-24 DIAGNOSIS — M810 Age-related osteoporosis without current pathological fracture: Secondary | ICD-10-CM | POA: Diagnosis not present

## 2016-01-21 DIAGNOSIS — Z1231 Encounter for screening mammogram for malignant neoplasm of breast: Secondary | ICD-10-CM | POA: Diagnosis not present

## 2016-01-21 LAB — HM MAMMOGRAPHY

## 2016-02-18 DIAGNOSIS — Z79899 Other long term (current) drug therapy: Secondary | ICD-10-CM | POA: Diagnosis not present

## 2016-02-18 DIAGNOSIS — G4700 Insomnia, unspecified: Secondary | ICD-10-CM | POA: Diagnosis not present

## 2016-02-18 DIAGNOSIS — K219 Gastro-esophageal reflux disease without esophagitis: Secondary | ICD-10-CM | POA: Diagnosis not present

## 2016-02-18 DIAGNOSIS — M810 Age-related osteoporosis without current pathological fracture: Secondary | ICD-10-CM | POA: Diagnosis not present

## 2016-02-18 DIAGNOSIS — F321 Major depressive disorder, single episode, moderate: Secondary | ICD-10-CM | POA: Diagnosis not present

## 2016-02-18 DIAGNOSIS — M059 Rheumatoid arthritis with rheumatoid factor, unspecified: Secondary | ICD-10-CM | POA: Diagnosis not present

## 2016-02-18 DIAGNOSIS — F419 Anxiety disorder, unspecified: Secondary | ICD-10-CM | POA: Diagnosis not present

## 2016-02-28 ENCOUNTER — Encounter: Payer: Self-pay | Admitting: Rheumatology

## 2016-04-15 ENCOUNTER — Other Ambulatory Visit: Payer: Self-pay | Admitting: Rheumatology

## 2016-04-15 DIAGNOSIS — Z79899 Other long term (current) drug therapy: Secondary | ICD-10-CM | POA: Diagnosis not present

## 2016-04-15 LAB — CBC WITH DIFFERENTIAL/PLATELET
Basophils Absolute: 0 cells/uL (ref 0–200)
Basophils Relative: 0 %
EOS PCT: 1 %
Eosinophils Absolute: 73 cells/uL (ref 15–500)
HCT: 46.2 % — ABNORMAL HIGH (ref 35.0–45.0)
Hemoglobin: 15.6 g/dL — ABNORMAL HIGH (ref 11.7–15.5)
Lymphocytes Relative: 40 %
Lymphs Abs: 2920 cells/uL (ref 850–3900)
MCH: 32.6 pg (ref 27.0–33.0)
MCHC: 33.8 g/dL (ref 32.0–36.0)
MCV: 96.7 fL (ref 80.0–100.0)
MPV: 11.1 fL (ref 7.5–12.5)
Monocytes Absolute: 803 cells/uL (ref 200–950)
Monocytes Relative: 11 %
NEUTROS PCT: 48 %
Neutro Abs: 3504 cells/uL (ref 1500–7800)
Platelets: 172 10*3/uL (ref 140–400)
RBC: 4.78 MIL/uL (ref 3.80–5.10)
RDW: 13.4 % (ref 11.0–15.0)
WBC: 7.3 10*3/uL (ref 3.8–10.8)

## 2016-04-15 LAB — COMPLETE METABOLIC PANEL WITH GFR
ALBUMIN: 4.1 g/dL (ref 3.6–5.1)
ALK PHOS: 70 U/L (ref 33–130)
ALT: 14 U/L (ref 6–29)
AST: 20 U/L (ref 10–35)
BUN: 16 mg/dL (ref 7–25)
CO2: 28 mmol/L (ref 20–31)
CREATININE: 0.82 mg/dL (ref 0.60–0.93)
Calcium: 10.3 mg/dL (ref 8.6–10.4)
Chloride: 105 mmol/L (ref 98–110)
GFR, Est African American: 83 mL/min (ref >=60)
GFR, Est Non African American: 72 mL/min (ref >=60)
Glucose, Bld: 73 mg/dL (ref 65–99)
POTASSIUM: 4.2 mmol/L (ref 3.5–5.3)
Sodium: 140 mmol/L (ref 135–146)
Total Bilirubin: 0.5 mg/dL (ref 0.2–1.2)
Total Protein: 6.6 g/dL (ref 6.1–8.1)

## 2016-04-17 NOTE — Progress Notes (Signed)
WNLs

## 2016-04-20 DIAGNOSIS — R350 Frequency of micturition: Secondary | ICD-10-CM | POA: Diagnosis not present

## 2016-04-20 DIAGNOSIS — N390 Urinary tract infection, site not specified: Secondary | ICD-10-CM | POA: Diagnosis not present

## 2016-04-21 ENCOUNTER — Telehealth: Payer: Self-pay | Admitting: Radiology

## 2016-04-21 NOTE — Telephone Encounter (Signed)
-----   Message from Bo Merino, MD sent at 04/17/2016  9:17 PM EST ----- WNLs

## 2016-04-21 NOTE — Telephone Encounter (Signed)
I have called patient to advise labs are normal  

## 2016-05-30 DIAGNOSIS — M810 Age-related osteoporosis without current pathological fracture: Secondary | ICD-10-CM

## 2016-05-30 DIAGNOSIS — M19041 Primary osteoarthritis, right hand: Secondary | ICD-10-CM | POA: Insufficient documentation

## 2016-05-30 DIAGNOSIS — M19042 Primary osteoarthritis, left hand: Secondary | ICD-10-CM

## 2016-05-30 DIAGNOSIS — M19071 Primary osteoarthritis, right ankle and foot: Secondary | ICD-10-CM

## 2016-05-30 DIAGNOSIS — M0609 Rheumatoid arthritis without rheumatoid factor, multiple sites: Secondary | ICD-10-CM

## 2016-05-30 DIAGNOSIS — Z79899 Other long term (current) drug therapy: Secondary | ICD-10-CM

## 2016-05-30 DIAGNOSIS — M19072 Primary osteoarthritis, left ankle and foot: Secondary | ICD-10-CM

## 2016-05-30 HISTORY — DX: Age-related osteoporosis without current pathological fracture: M81.0

## 2016-05-30 HISTORY — DX: Primary osteoarthritis, right hand: M19.042

## 2016-05-30 HISTORY — DX: Other long term (current) drug therapy: Z79.899

## 2016-05-30 HISTORY — DX: Primary osteoarthritis, left hand: M19.041

## 2016-05-30 HISTORY — DX: Rheumatoid arthritis without rheumatoid factor, multiple sites: M06.09

## 2016-05-30 HISTORY — DX: Primary osteoarthritis, right ankle and foot: M19.071

## 2016-05-30 NOTE — Progress Notes (Deleted)
   Office Visit Note  Patient: Joy Fernandez             Date of Birth: 1946-02-18           MRN: [id number]             PCP: Ernestene Kiel, MD Referring: Ernestene Kiel, MD Visit Date: 06/09/2016 Occupation: @GUAROCC @    Subjective:  No chief complaint on file.   History of Present Illness: Joy Fernandez is a 71 y.o. female ***   Activities of Daily Living:  Patient reports morning stiffness for *** {minute/hour:19697}.   Patient {ACTIONS;DENIES/REPORTS:21021675::"Denies"} nocturnal pain.  Difficulty dressing/grooming: {ACTIONS;DENIES/REPORTS:21021675::"Denies"} Difficulty climbing stairs: {ACTIONS;DENIES/REPORTS:21021675::"Denies"} Difficulty getting out of chair: {ACTIONS;DENIES/REPORTS:21021675::"Denies"} Difficulty using hands for taps, buttons, cutlery, and/or writing: {ACTIONS;DENIES/REPORTS:21021675::"Denies"}   No Rheumatology ROS completed.   PMFS History:  Patient Active Problem List   Diagnosis Date Noted  . Rheumatoid arthritis of multiple sites with negative rheumatoid factor ([place]) 05/30/2016  . Primary osteoarthritis of both feet 05/30/2016  . Primary osteoarthritis of both hands 05/30/2016  . High risk medication use 05/30/2016  . Age-related osteoporosis without current pathological fracture 05/30/2016  . ITP (idiopathic thrombocytopenic purpura) 02/24/2011    Past Medical History:  Diagnosis Date  . ITP (idiopathic thrombocytopenic purpura) 02/24/2011    No family history on file. No past surgical history on file. Social History   Social History Narrative  . No narrative on file     Objective: Vital Signs: There were no vitals taken for this visit.   Physical Exam   Musculoskeletal Exam: ***  CDAI Exam: No CDAI exam completed.    Investigation: Findings:  02/13/2015 X-rays of the bilateral hands, 2 views, versus May 2014 show bilateral radial carpal joint space narrowing and intercarpal joint space narrowing and bilateral MCP, PIP,  and DIP joint space narrowing.  No erosions.  No changes versus May 2014.   X-rays of the bilateral feet, 2 views, versus October 2009 show bilateral intertarsal joint space narrowing, bilateral first MTP joint space narrowing, and PIP and DIP joint space narrowing.  No erosions.  No changes versus October 2009.  07/29/2012 negative TB gold and Hepatitis panel     Imaging: No results found.  Speciality Comments: No specialty comments available.    Procedures:  No procedures performed Allergies: Other   Assessment / Plan:     Visit Diagnoses: Rheumatoid arthritis of multiple sites with negative rheumatoid factor (HCC)  Primary osteoarthritis of both feet  Primary osteoarthritis of both hands  High risk medication use - Methotrexate   Age-related osteoporosis without current pathological fracture - 2015  left distal radius with a T-score of -3.5.      Orders: No orders of the defined types were placed in this encounter.  No orders of the defined types were placed in this encounter.   Face-to-face time spent with patient was *** minutes. 50% of time was spent in counseling and coordination of care.  Follow-Up Instructions: No Follow-up on file.   Verginia Toohey, RT  Note - This record has been created using [employer].  Chart creation errors have been sought, but may not always  have been located. Such creation errors do not reflect on  the standard of medical care.

## 2016-06-06 DIAGNOSIS — Z8719 Personal history of other diseases of the digestive system: Secondary | ICD-10-CM

## 2016-06-06 DIAGNOSIS — Z8659 Personal history of other mental and behavioral disorders: Secondary | ICD-10-CM

## 2016-06-06 HISTORY — DX: Personal history of other mental and behavioral disorders: Z86.59

## 2016-06-06 HISTORY — DX: Personal history of other diseases of the digestive system: Z87.19

## 2016-06-09 ENCOUNTER — Ambulatory Visit: Payer: Self-pay | Admitting: Rheumatology

## 2016-06-25 NOTE — Progress Notes (Deleted)
Office Visit Note  Patient: Joy Fernandez             Date of Birth: 1946/01/18           MRN: [id number]             PCP: Ernestene Kiel, MD Referring: Ernestene Kiel, MD Visit Date: 07/03/2016 Occupation: @GUAROCC @    Subjective:  No chief complaint on file.   History of Present Illness: Joy Fernandez is a 71 y.o. female ***   Activities of Daily Living:  Patient reports morning stiffness for *** {minute/hour:19697}.   Patient {ACTIONS;DENIES/REPORTS:21021675::"Denies"} nocturnal pain.  Difficulty dressing/grooming: {ACTIONS;DENIES/REPORTS:21021675::"Denies"} Difficulty climbing stairs: {ACTIONS;DENIES/REPORTS:21021675::"Denies"} Difficulty getting out of chair: {ACTIONS;DENIES/REPORTS:21021675::"Denies"} Difficulty using hands for taps, buttons, cutlery, and/or writing: {ACTIONS;DENIES/REPORTS:21021675::"Denies"}   No Rheumatology ROS completed.   PMFS History:  Patient Active Problem List   Diagnosis Date Noted  . History of depression 06/06/2016  . History of gastroesophageal reflux (GERD) 06/06/2016  . Rheumatoid arthritis of multiple sites with negative rheumatoid factor ([place]) 05/30/2016  . Primary osteoarthritis of both feet 05/30/2016  . Primary osteoarthritis of both hands 05/30/2016  . High risk medication use 05/30/2016  . Age-related osteoporosis without current pathological fracture 05/30/2016  . Idiopathic thrombocytopenic purpura ([place]) 02/24/2011    Past Medical History:  Diagnosis Date  . ITP (idiopathic thrombocytopenic purpura) 02/24/2011    No family history on file. No past surgical history on file. Social History   Social History Narrative  . No narrative on file     Objective: Vital Signs: There were no vitals taken for this visit.   Physical Exam   Musculoskeletal Exam: ***  CDAI Exam: No CDAI exam completed.    Investigation: Findings:  DEXA 12/25/2015 T score -3.31 October 2013 T score was -3.5, consistent with  osteoporosis. bone density, which was a different site, showed a T-score of -2.5 in 2011.  Labs from September 25, 2015, shows CBC with diff normal, CMP with GFR normal except for slight elevation in calcium at 10.5.   07/29/2012 negative Hep Panel and TB gold   Orders Only on 04/15/2016  Component Date Value Ref Range Status  . Sodium 04/15/2016 140  135 - 146 mmol/L Final  . Potassium 04/15/2016 4.2  3.5 - 5.3 mmol/L Final  . Chloride 04/15/2016 105  98 - 110 mmol/L Final  . CO2 04/15/2016 28  20 - 31 mmol/L Final  . Glucose, Bld 04/15/2016 73  65 - 99 mg/dL Final  . BUN 04/15/2016 16  7 - 25 mg/dL Final  . Creat 04/15/2016 0.82  0.60 - 0.93 mg/dL Final   Comment:   For patients > or = 73 years of age: The upper reference limit for Creatinine is approximately 13% higher for people identified as African-American.     . Total Bilirubin 04/15/2016 0.5  0.2 - 1.2 mg/dL Final  . Alkaline Phosphatase 04/15/2016 70  33 - 130 U/L Final  . AST 04/15/2016 20  10 - 35 U/L Final  . ALT 04/15/2016 14  6 - 29 U/L Final  . Total Protein 04/15/2016 6.6  6.1 - 8.1 g/dL Final  . Albumin 04/15/2016 4.1  3.6 - 5.1 g/dL Final  . Calcium 04/15/2016 10.3  8.6 - 10.4 mg/dL Final  . GFR, Est African American 04/15/2016 83  >=60 mL/min Final  . GFR, Est Non African American 04/15/2016 72  >=60 mL/min Final  . WBC 04/15/2016 7.3  3.8 - 10.8 K/uL Final  . RBC  04/15/2016 4.78  3.80 - 5.10 MIL/uL Final  . Hemoglobin 04/15/2016 15.6* 11.7 - 15.5 g/dL Final  . HCT 04/15/2016 46.2* 35.0 - 45.0 % Final  . MCV 04/15/2016 96.7  80.0 - 100.0 fL Final  . MCH 04/15/2016 32.6  27.0 - 33.0 pg Final  . MCHC 04/15/2016 33.8  32.0 - 36.0 g/dL Final  . RDW 04/15/2016 13.4  11.0 - 15.0 % Final  . Platelets 04/15/2016 172  140 - 400 K/uL Final  . MPV 04/15/2016 11.1  7.5 - 12.5 fL Final  . Neutro Abs 04/15/2016 3504  1,500 - 7,800 cells/uL Final  . Lymphs Abs 04/15/2016 2920  850 - 3,900 cells/uL Final  . Monocytes Absolute  04/15/2016 803  200 - 950 cells/uL Final  . Eosinophils Absolute 04/15/2016 73  15 - 500 cells/uL Final  . Basophils Absolute 04/15/2016 0  0 - 200 cells/uL Final  . Neutrophils Relative % 04/15/2016 48  % Final  . Lymphocytes Relative 04/15/2016 40  % Final  . Monocytes Relative 04/15/2016 11  % Final  . Eosinophils Relative 04/15/2016 1  % Final  . Basophils Relative 04/15/2016 0  % Final  . Smear Review 04/15/2016 Criteria for review not met   Final    Imaging: No results found.  Speciality Comments: No specialty comments available.    Procedures:  No procedures performed Allergies: Other   Assessment / Plan:     Visit Diagnoses: Rheumatoid arthritis of multiple sites with negative rheumatoid factor (HCC)  Age-related osteoporosis without current pathological fracture  Primary osteoarthritis of both feet  Primary osteoarthritis of both hands  High risk medication use   She also has Idiopathic thrombocytopenic purpura ([place]); depression; and History of gastroesophageal reflux (GERD) on her problem list.  Orders: No orders of the defined types were placed in this encounter.  No orders of the defined types were placed in this encounter.   Face-to-face time spent with patient was *** minutes. 50% of time was spent in counseling and coordination of care.  Follow-Up Instructions: No Follow-up on file.   Amy Littrell, RT  Note - This record has been created using [employer].  Chart creation errors have been sought, but may not always  have been located. Such creation errors do not reflect on  the standard of medical care.

## 2016-07-03 ENCOUNTER — Ambulatory Visit: Payer: Self-pay | Admitting: Rheumatology

## 2016-07-10 NOTE — Progress Notes (Signed)
Office Visit Note  Patient: Joy Fernandez             Date of Birth: Aug 11, 1945           MRN: [id number]             PCP: Ernestene Kiel, MD Referring: Ernestene Kiel, MD Visit Date: 07/24/2016 Occupation: @GUAROCC @    Subjective:  Mild back stiffness in the morning   History of Present Illness: Joy Fernandez is a 71 y.o. female with a history of rheumatoid arthritis.  Patient states she has been taking 7 tablets a week of methotrexate as prescribed with no side effects.  She states she has been taking 2 tablets of folic acid daily.  Patient states she feels her RA is well managed.  She ranks her pain in her hands 1/10.  Patient denies any joint swelling.  Patient states she may need a refill of MTX and folic acid, and she usually gets a 24-month supply.    Patient states she has been experiencing morning stiffness in her back.  Denies any pain and states it quickly improves after getting up and moving around.    Activities of Daily Living:  Patient reports morning stiffness for 5 minutes.   Patient Denies nocturnal pain.  Difficulty dressing/grooming: Denies Difficulty climbing stairs: Denies Difficulty getting out of chair: Denies Difficulty using hands for taps, buttons, cutlery, and/or writing: Denies   Review of Systems  Constitutional: Negative for fatigue, night sweats, weight gain, weight loss and weakness.  HENT: Negative for mouth sores, trouble swallowing, trouble swallowing, mouth dryness and nose dryness.   Eyes: Negative for pain, redness, visual disturbance and dryness.  Respiratory: Negative for cough, shortness of breath and difficulty breathing.   Cardiovascular: Negative for chest pain, palpitations, hypertension, irregular heartbeat and swelling in legs/feet.  Gastrointestinal: Negative for blood in stool, constipation, diarrhea and vomiting.  Endocrine: Negative for increased urination.  Genitourinary: Negative for painful urination and vaginal dryness.    Musculoskeletal: Positive for morning stiffness. Negative for arthralgias, joint pain, joint swelling, myalgias, muscle weakness, muscle tenderness and myalgias.  Skin: Negative for color change, rash, hair loss, skin tightness, ulcers and sensitivity to sunlight.  Allergic/Immunologic: Negative for susceptible to infections.  Neurological: Negative for dizziness, memory loss and night sweats.  Hematological: Negative for swollen glands.  Psychiatric/Behavioral: Positive for depressed mood and sleep disturbance. The patient is nervous/anxious.     PMFS History:  Patient Active Problem List   Diagnosis Date Noted  . History of depression 06/06/2016  . History of gastroesophageal reflux (GERD) 06/06/2016  . Rheumatoid arthritis of multiple sites with negative rheumatoid factor ([place]) 05/30/2016  . Primary osteoarthritis of both feet 05/30/2016  . Primary osteoarthritis of both hands 05/30/2016  . High risk medication use 05/30/2016  . Age-related osteoporosis without current pathological fracture 05/30/2016  . Idiopathic thrombocytopenic purpura ([place]) 02/24/2011    Past Medical History:  Diagnosis Date  . ITP (idiopathic thrombocytopenic purpura) 02/24/2011    No family history on file. No past surgical history on file. Social History   Social History Narrative  . No narrative on file     Objective: Vital Signs: BP 120/70   Pulse 70   Resp 14   Ht 5\' 2"  (1.575 m)   Wt 152 lb (68.9 kg)   BMI 27.80 kg/m    Physical Exam  Constitutional: She is oriented to person, place, and time. She appears well-developed and well-nourished.  HENT:  Head: Normocephalic and  atraumatic.  Eyes: Conjunctivae and EOM are normal.  Neck: Normal range of motion. Neck supple.  Cardiovascular: Normal rate, regular rhythm, normal heart sounds and intact distal pulses.   Pulmonary/Chest: Effort normal and breath sounds normal.  Abdominal: Soft. Bowel sounds are normal.  Lymphadenopathy:    She  has no cervical adenopathy.  Neurological: She is alert and oriented to person, place, and time.  Skin: Skin is warm and dry. Capillary refill takes less than 2 seconds.  Psychiatric: She has a normal mood and affect. Her behavior is normal.  Nursing note and vitals reviewed.    Musculoskeletal Exam: C-spine and thoracic lumbar spine good range of motion. Shoulder joints elbow joints wrist joints are good range of motion. She has thickening of bilateral PIP/DIP joints and CMC joints. No synovitis was noted. Hip joints knee joints ankles MTPs PIPs with good range of motion. She has thickening of PIP/DIP joints and bilateral first MTP joint consistent with osteoarthritis no synovitis was noted.  CDAI Exam: CDAI Homunculus Exam:   Joint Counts:  CDAI Tender Joint count: 0 CDAI Swollen Joint count: 0  Global Assessments:  Patient Global Assessment: 1 Provider Global Assessment: 1  CDAI Calculated Score: 2    Investigation: Findings:  DEXA 12/25/2015 T score -3.31 October 2013 T score was -3.5, consistent with osteoporosis. bone density, which was a different site, showed a T-score of -2.5 in 2011.  Labs from September 25, 2015, shows CBC with diff normal, CMP with GFR normal except for slight elevation in calcium at 10.5.   07/29/2012 negative Hep Panel and TB gold  04/15/2016 CBC normal,CMP normal Imaging: No results found.  Speciality Comments: No specialty comments available.    Procedures:  No procedures performed Allergies: Other and Codeine   Assessment / Plan:     Visit Diagnoses: Rheumatoid arthritis of multiple sites with negative rheumatoid factor ([place]): she seems to be doing well on current medication regimen. She has no synovitis on examination.  High risk medication use - Methotrexate 7 tablets by mouth every week, folic acid 2 mg by mouth daily. We will check labs today and then every 3 months to monitor for drug toxicity. - Plan: CBC with Differential/Platelet,  COMPLETE METABOLIC PANEL WITH GFR, CBC with Differential/Platelet, COMPLETE METABOLIC PANEL WITH GFR  Primary osteoarthritis of both hands: She does have osteoarthritic changes in her hands for which we have discussed joint protection and muscle strengthening.  Primary osteoarthritis of both feet: Proper fitting shoes were discussed.  Age-related osteoporosis without current pathological fracture - On Boniva. Patient's aware of getting bone densities on regular basis.  History of gastroesophageal reflux (GERD)  History of depression  History of thrombocytopenia    Orders: Orders Placed This Encounter  Procedures  . CBC with Differential/Platelet  . COMPLETE METABOLIC PANEL WITH GFR  . CBC with Differential/Platelet  . COMPLETE METABOLIC PANEL WITH GFR   Meds ordered this encounter  Medications  . methotrexate (RHEUMATREX) 2.5 MG tablet    Sig: Take 7 tablets (17.5 mg total) by mouth once a week.    Dispense:  84 tablet    Refill:  0  . folic acid (FOLVITE) 1 MG tablet    Sig: Take 2 tablets (2 mg total) by mouth daily.    Dispense:  180 tablet    Refill:  3    Face-to-face time spent with patient was 30 minutes. 50% of time was spent in counseling and coordination of care.  Follow-Up Instructions: Return in  about 5 months (around 12/24/2016) for Rheumatoid arthritis.   Bo Merino, MD  Note - This record has been created using Editor, commissioning.  Chart creation errors have been sought, but may not always  have been located. Such creation errors do not reflect on  the standard of medical care.

## 2016-07-24 ENCOUNTER — Encounter: Payer: Self-pay | Admitting: Rheumatology

## 2016-07-24 ENCOUNTER — Ambulatory Visit (INDEPENDENT_AMBULATORY_CARE_PROVIDER_SITE_OTHER): Payer: PPO | Admitting: Rheumatology

## 2016-07-24 VITALS — BP 120/70 | HR 70 | Resp 14 | Ht 62.0 in | Wt 152.0 lb

## 2016-07-24 DIAGNOSIS — M19072 Primary osteoarthritis, left ankle and foot: Secondary | ICD-10-CM | POA: Diagnosis not present

## 2016-07-24 DIAGNOSIS — M19041 Primary osteoarthritis, right hand: Secondary | ICD-10-CM | POA: Diagnosis not present

## 2016-07-24 DIAGNOSIS — M810 Age-related osteoporosis without current pathological fracture: Secondary | ICD-10-CM

## 2016-07-24 DIAGNOSIS — Z8719 Personal history of other diseases of the digestive system: Secondary | ICD-10-CM

## 2016-07-24 DIAGNOSIS — Z8659 Personal history of other mental and behavioral disorders: Secondary | ICD-10-CM

## 2016-07-24 DIAGNOSIS — M19042 Primary osteoarthritis, left hand: Secondary | ICD-10-CM | POA: Diagnosis not present

## 2016-07-24 DIAGNOSIS — Z79899 Other long term (current) drug therapy: Secondary | ICD-10-CM

## 2016-07-24 DIAGNOSIS — Z862 Personal history of diseases of the blood and blood-forming organs and certain disorders involving the immune mechanism: Secondary | ICD-10-CM | POA: Diagnosis not present

## 2016-07-24 DIAGNOSIS — M0609 Rheumatoid arthritis without rheumatoid factor, multiple sites: Secondary | ICD-10-CM

## 2016-07-24 DIAGNOSIS — M19071 Primary osteoarthritis, right ankle and foot: Secondary | ICD-10-CM | POA: Diagnosis not present

## 2016-07-24 MED ORDER — FOLIC ACID 1 MG PO TABS: 2.0000 mg | ORAL_TABLET | Freq: Every day | ORAL | 3 refills | Status: DC

## 2016-07-24 MED ORDER — METHOTREXATE (ANTI-RHEUMATIC) 2.5 MG PO TABS: 17.5000 mg | ORAL_TABLET | ORAL | 0 refills | Status: DC

## 2016-07-24 NOTE — Progress Notes (Signed)
Rheumatology Medication Review by a Pharmacist Does the patient feel that his/her medications are working for him/her?  Yes Has the patient been experiencing any side effects to the medications prescribed?  No Does the patient have any problems obtaining medications?  No  Issues to address at subsequent visits: None   Pharmacist comments:  Joy Fernandez is a pleasant 71 yo F who presents for follow up of her sero-negative rheumatoid arthritis.  She is currently taking methotrexate 17.5 mg (7 tablets) weekly and folic acid 2 mg daily.  Fernandez recent standing labs were on 04/15/16.  She is due for standing labs again today.  Patient denies any questions or concerns regarding her medications at this time.    Joy Fernandez, Pharm.D., BCPS, CPP Clinical Pharmacist Pager: [phone] Phone: [phone] 07/24/2016 12:11 PM

## 2016-07-24 NOTE — Patient Instructions (Signed)
Standing Labs We placed an order today for your standing lab work.    Please come back and get your standing labs in July 2018 and every 3 months.   We have open lab Monday through Friday from 8:30-11:30 AM and 1:30-4 PM at the office of Dr. Tresa Moore, PA.   The office is located at [address] [place], [place], [place] [address] No appointment is necessary.   Labs are drawn by [facility].  You may receive a bill from [facility] for your lab work.

## 2016-07-25 LAB — CBC WITH DIFFERENTIAL/PLATELET
BASOS ABS: 0 {cells}/uL (ref 0–200)
Basophils Relative: 0 %
EOS ABS: 204 {cells}/uL (ref 15–500)
Eosinophils Relative: 3 %
HCT: 46.1 % — ABNORMAL HIGH (ref 35.0–45.0)
Hemoglobin: 15.4 g/dL (ref 11.7–15.5)
Lymphocytes Relative: 20 %
Lymphs Abs: 1360 cells/uL (ref 850–3900)
MCH: 32.7 pg (ref 27.0–33.0)
MCHC: 33.4 g/dL (ref 32.0–36.0)
MCV: 97.9 fL (ref 80.0–100.0)
MONOS PCT: 10 %
MPV: 11.8 fL (ref 7.5–12.5)
Monocytes Absolute: 680 cells/uL (ref 200–950)
Neutro Abs: 4556 cells/uL (ref 1500–7800)
Neutrophils Relative %: 67 %
Platelets: 178 10*3/uL (ref 140–400)
RBC: 4.71 MIL/uL (ref 3.80–5.10)
RDW: 13.8 % (ref 11.0–15.0)
WBC: 6.8 10*3/uL (ref 3.8–10.8)

## 2016-07-25 LAB — COMPLETE METABOLIC PANEL WITH GFR
AG Ratio: 1.7 Ratio (ref 1.0–2.5)
ALBUMIN: 3.9 g/dL (ref 3.6–5.1)
ALK PHOS: 73 U/L (ref 33–130)
ALT: 16 U/L (ref 6–29)
AST: 25 U/L (ref 10–35)
BUN/Creatinine Ratio: 17.5 Ratio (ref 6–22)
BUN: 14 mg/dL (ref 7–25)
CALCIUM: 10 mg/dL (ref 8.6–10.4)
CHLORIDE: 107 mmol/L (ref 98–110)
CO2: 28 mmol/L (ref 20–31)
Creat: 0.8 mg/dL (ref 0.60–0.93)
GFR, Est African American: 85 mL/min (ref >=60)
GFR, Est Non African American: 74 mL/min (ref >=60)
Globulin: 2.3 g/dL (ref 1.9–3.7)
Glucose, Bld: 50 mg/dL — ABNORMAL LOW (ref 65–99)
Potassium: 5.1 mmol/L (ref 3.5–5.3)
Sodium: 142 mmol/L (ref 135–146)
TOTAL PROTEIN: 6.2 g/dL (ref 6.1–8.1)
Total Bilirubin: 0.4 mg/dL (ref 0.2–1.2)

## 2016-07-25 NOTE — Progress Notes (Signed)
WNL

## 2016-07-28 ENCOUNTER — Telehealth: Payer: Self-pay | Admitting: Radiology

## 2016-07-28 NOTE — Telephone Encounter (Signed)
-----   Message from Bo Merino, MD sent at 07/25/2016  9:55 AM EDT ----- WNL

## 2016-07-28 NOTE — Telephone Encounter (Signed)
I have called patient to advise labs are normal  

## 2016-08-18 DIAGNOSIS — G4700 Insomnia, unspecified: Secondary | ICD-10-CM | POA: Diagnosis not present

## 2016-08-18 DIAGNOSIS — F419 Anxiety disorder, unspecified: Secondary | ICD-10-CM | POA: Diagnosis not present

## 2016-08-18 DIAGNOSIS — M059 Rheumatoid arthritis with rheumatoid factor, unspecified: Secondary | ICD-10-CM | POA: Diagnosis not present

## 2016-08-18 DIAGNOSIS — M810 Age-related osteoporosis without current pathological fracture: Secondary | ICD-10-CM | POA: Diagnosis not present

## 2016-08-18 DIAGNOSIS — F321 Major depressive disorder, single episode, moderate: Secondary | ICD-10-CM | POA: Diagnosis not present

## 2016-08-18 DIAGNOSIS — H8110 Benign paroxysmal vertigo, unspecified ear: Secondary | ICD-10-CM | POA: Diagnosis not present

## 2016-09-23 DIAGNOSIS — Z85828 Personal history of other malignant neoplasm of skin: Secondary | ICD-10-CM | POA: Diagnosis not present

## 2016-09-23 DIAGNOSIS — D224 Melanocytic nevi of scalp and neck: Secondary | ICD-10-CM | POA: Diagnosis not present

## 2016-09-23 DIAGNOSIS — L821 Other seborrheic keratosis: Secondary | ICD-10-CM | POA: Diagnosis not present

## 2016-09-23 DIAGNOSIS — C4441 Basal cell carcinoma of skin of scalp and neck: Secondary | ICD-10-CM | POA: Diagnosis not present

## 2016-09-23 DIAGNOSIS — Z808 Family history of malignant neoplasm of other organs or systems: Secondary | ICD-10-CM | POA: Diagnosis not present

## 2016-09-23 DIAGNOSIS — D485 Neoplasm of uncertain behavior of skin: Secondary | ICD-10-CM | POA: Diagnosis not present

## 2016-09-23 DIAGNOSIS — Z411 Encounter for cosmetic surgery: Secondary | ICD-10-CM | POA: Diagnosis not present

## 2016-09-23 DIAGNOSIS — C44319 Basal cell carcinoma of skin of other parts of face: Secondary | ICD-10-CM | POA: Diagnosis not present

## 2016-09-23 DIAGNOSIS — L918 Other hypertrophic disorders of the skin: Secondary | ICD-10-CM | POA: Diagnosis not present

## 2016-10-27 DIAGNOSIS — R32 Unspecified urinary incontinence: Secondary | ICD-10-CM | POA: Diagnosis not present

## 2016-10-27 DIAGNOSIS — N898 Other specified noninflammatory disorders of vagina: Secondary | ICD-10-CM | POA: Diagnosis not present

## 2016-10-27 DIAGNOSIS — R3915 Urgency of urination: Secondary | ICD-10-CM | POA: Diagnosis not present

## 2016-11-12 DIAGNOSIS — N898 Other specified noninflammatory disorders of vagina: Secondary | ICD-10-CM | POA: Diagnosis not present

## 2016-12-02 DIAGNOSIS — C44319 Basal cell carcinoma of skin of other parts of face: Secondary | ICD-10-CM | POA: Diagnosis not present

## 2016-12-02 DIAGNOSIS — C4441 Basal cell carcinoma of skin of scalp and neck: Secondary | ICD-10-CM | POA: Diagnosis not present

## 2016-12-16 NOTE — Progress Notes (Signed)
Office Visit Note  Patient: Joy Fernandez             Date of Birth: 1945-05-27           MRN: [id number]             PCP: Ernestene Kiel, MD Referring: Ernestene Kiel, MD Visit Date: 12/29/2016 Occupation: @GUAROCC @    Subjective:  Joint stiffness.   History of Present Illness: Joy Fernandez is a 71 y.o. female with history of seronegative rheumatoid arthritis. She states she's doing quite well on methotrexate 7 tablets per week. She denies any joint swelling. She has some stiffness in her joints due to underlying osteoarthritis.  Activities of Daily Living:  Patient reports morning stiffness for 5 minutes.   Patient Denies nocturnal pain.  Difficulty dressing/grooming: Denies Difficulty climbing stairs: Denies Difficulty getting out of chair: Denies Difficulty using hands for taps, buttons, cutlery, and/or writing: Denies   Review of Systems  Constitutional: Negative for fatigue, night sweats, weight gain, weight loss and weakness.  HENT: Negative for mouth sores, trouble swallowing, trouble swallowing, mouth dryness and nose dryness.   Eyes: Negative for pain, redness, visual disturbance and dryness.  Respiratory: Negative for cough, shortness of breath and difficulty breathing.   Cardiovascular: Negative for chest pain, palpitations, hypertension, irregular heartbeat and swelling in legs/feet.  Gastrointestinal: Negative for blood in stool, constipation and diarrhea.  Endocrine: Negative for increased urination.  Genitourinary: Negative for vaginal dryness.  Musculoskeletal: Positive for morning stiffness. Negative for joint swelling, myalgias, muscle weakness, muscle tenderness and myalgias.  Skin: Negative for color change, rash, hair loss, skin tightness, ulcers and sensitivity to sunlight.  Allergic/Immunologic: Negative for susceptible to infections.  Neurological: Negative for dizziness, memory loss and night sweats.  Hematological: Negative for swollen glands.    Psychiatric/Behavioral: Negative for depressed mood and sleep disturbance. The patient is not nervous/anxious.     PMFS History:  Patient Active Problem List   Diagnosis Date Noted  . History of depression 06/06/2016  . History of gastroesophageal reflux (GERD) 06/06/2016  . Rheumatoid arthritis of multiple sites with negative rheumatoid factor ([place]) 05/30/2016  . Primary osteoarthritis of both feet 05/30/2016  . Primary osteoarthritis of both hands 05/30/2016  . High risk medication use 05/30/2016  . Age-related osteoporosis without current pathological fracture 05/30/2016  . Idiopathic thrombocytopenic purpura ([place]) 02/24/2011    Past Medical History:  Diagnosis Date  . Arthritis   . Depression   . Insomnia   . ITP (idiopathic thrombocytopenic purpura) 02/24/2011  . Osteoporosis     No family history on file. History reviewed. No pertinent surgical history. Social History   Social History Narrative  . No narrative on file     Objective: Vital Signs: BP 125/68 (BP Location: Left Arm, Patient Position: Sitting, Cuff Size: Large)   Pulse 74   Resp 12   Wt 151 lb (68.5 kg)   BMI 27.62 kg/m    Physical Exam  Constitutional: She is oriented to person, place, and time. She appears well-developed and well-nourished.  HENT:  Head: Normocephalic and atraumatic.  Eyes: Conjunctivae and EOM are normal.  Neck: Normal range of motion.  Cardiovascular: Normal rate, regular rhythm, normal heart sounds and intact distal pulses.   Pulmonary/Chest: Effort normal and breath sounds normal.  Abdominal: Soft. Bowel sounds are normal.  Lymphadenopathy:    She has no cervical adenopathy.  Neurological: She is alert and oriented to person, place, and time.  Skin: Skin is warm  and dry. Capillary refill takes less than 2 seconds.  Psychiatric: She has a normal mood and affect. Her behavior is normal.  Nursing note and vitals reviewed.    Musculoskeletal Exam: C-spine and thoracic  lumbar spine good range of motion. Shoulder joints elbow joints wrist joints are good range of motion. She has some PIP/DIP thickening in her hands consistent with osteoarthritis. Hip joints knee joints ankles MTPs PIPs with good range of motion. She has thickening of bilateral MTPs PIPs DIPs consistent with osteoarthritis. No synovitis was noted.  CDAI Exam: CDAI Homunculus Exam:   Joint Counts:  CDAI Tender Joint count: 0 CDAI Swollen Joint count: 0  Global Assessments:  Patient Global Assessment: 1 Provider Global Assessment: 1  CDAI Calculated Score: 2    Investigation: No additional findings. CBC Latest Ref Rng & Units 07/24/2016 04/15/2016 02/21/2013  WBC 3.8 - 10.8 K/uL 6.8 7.3 4.8  Hemoglobin 11.7 - 15.5 g/dL 15.4 15.6(H) 14.2  Hematocrit 35.0 - 45.0 % 46.1(H) 46.2(H) 41.8  Platelets 140 - 400 K/uL 178 172 146     CMP Latest Ref Rng & Units 07/24/2016 04/15/2016  Glucose 65 - 99 mg/dL 50(L) 73  BUN 7 - 25 mg/dL 14 16  Creatinine 0.60 - 0.93 mg/dL 0.80 0.82  Sodium 135 - 146 mmol/L 142 140  Potassium 3.5 - 5.3 mmol/L 5.1 4.2  Chloride 98 - 110 mmol/L 107 105  CO2 20 - 31 mmol/L 28 28  Calcium 8.6 - 10.4 mg/dL 10.0 10.3  Total Protein 6.1 - 8.1 g/dL 6.2 6.6  Total Bilirubin 0.2 - 1.2 mg/dL 0.4 0.5  Alkaline Phos 33 - 130 U/L 73 70  AST 10 - 35 U/L 25 20  ALT 6 - 29 U/L 16 14    Imaging: No results found.  Speciality Comments: No specialty comments available.    Procedures:  No procedures performed Allergies: Other and Codeine   Assessment / Plan:     Visit Diagnoses: Rheumatoid arthritis of multiple sites with negative rheumatoid factor [facility]): Patient has no active synovitis. She complains of minimal morning stiffness. I discussed decreasing methotrexate to 6 tablets per week. She is hesitant until the winter is over. We will wait until next visit.  High risk medication use - Methotrexate 7 tablets every every week, folic acid 2 mg by mouth daily - Plan: CBC  with Differential/Platelet, COMPLETE METABOLIC PANEL WITH GFR. Her labs are past-due we will check her labs today and then every 3 months to monitor for drug toxicity. Need for having regular labs were discussed.  Primary osteoarthritis of both hands: Joint protection and muscle strengthening discussed.  Primary osteoarthritis of both feet: Proper fitting shoes were discussed.  Age-related osteoporosis without current pathological fracture - on Boniva , DEXA 12/25/2015 T score -3.31 October 2013 T score was -3.5. Use of calcium and vitamin D and resistive exercises were discussed.  Other medical problems are listed as follows:  Idiopathic thrombocytopenic purpura ([place])  History of gastroesophageal reflux (GERD)  History of depression    Orders: No orders of the defined types were placed in this encounter.  No orders of the defined types were placed in this encounter.   Follow-Up Instructions: Return in about 5 months (around 05/29/2017) for Rheumatoid arthritis, Osteoarthritis,OP.   Bo Merino, MD  Note - This record has been created using Editor, commissioning.  Chart creation errors have been sought, but may not always  have been located. Such creation errors do not reflect on  the standard of medical care. 

## 2016-12-29 ENCOUNTER — Ambulatory Visit (INDEPENDENT_AMBULATORY_CARE_PROVIDER_SITE_OTHER): Payer: PPO | Admitting: Rheumatology

## 2016-12-29 ENCOUNTER — Encounter: Payer: Self-pay | Admitting: Rheumatology

## 2016-12-29 VITALS — BP 125/68 | HR 74 | Resp 12 | Wt 151.0 lb

## 2016-12-29 DIAGNOSIS — D693 Immune thrombocytopenic purpura: Secondary | ICD-10-CM | POA: Diagnosis not present

## 2016-12-29 DIAGNOSIS — M19071 Primary osteoarthritis, right ankle and foot: Secondary | ICD-10-CM | POA: Diagnosis not present

## 2016-12-29 DIAGNOSIS — Z79899 Other long term (current) drug therapy: Secondary | ICD-10-CM | POA: Diagnosis not present

## 2016-12-29 DIAGNOSIS — M19042 Primary osteoarthritis, left hand: Secondary | ICD-10-CM

## 2016-12-29 DIAGNOSIS — M19041 Primary osteoarthritis, right hand: Secondary | ICD-10-CM | POA: Diagnosis not present

## 2016-12-29 DIAGNOSIS — Z8719 Personal history of other diseases of the digestive system: Secondary | ICD-10-CM | POA: Diagnosis not present

## 2016-12-29 DIAGNOSIS — M810 Age-related osteoporosis without current pathological fracture: Secondary | ICD-10-CM | POA: Diagnosis not present

## 2016-12-29 DIAGNOSIS — Z8659 Personal history of other mental and behavioral disorders: Secondary | ICD-10-CM

## 2016-12-29 DIAGNOSIS — M19072 Primary osteoarthritis, left ankle and foot: Secondary | ICD-10-CM | POA: Diagnosis not present

## 2016-12-29 DIAGNOSIS — M0609 Rheumatoid arthritis without rheumatoid factor, multiple sites: Secondary | ICD-10-CM | POA: Diagnosis not present

## 2016-12-29 NOTE — Patient Instructions (Signed)
Standing Labs We placed an order today for your standing lab work.    Please come back and get your standing labs in January and every 3 months  We have open lab Monday through Friday from 8:30-11:30 AM and 1:30-4 PM at the office of Dr. Maruice Pieroni.   The office is located at [address], [address], [place], [place] [address] No appointment is necessary.   Labs are drawn by [facility].  You may receive a bill from [facility] for your lab work. If you have any questions regarding directions or hours of operation,  please call [phone].    

## 2016-12-30 LAB — COMPLETE METABOLIC PANEL WITH GFR
AG RATIO: 1.8 (calc) (ref 1.0–2.5)
ALKALINE PHOSPHATASE (APISO): 64 U/L (ref 33–130)
ALT: 17 U/L (ref 6–29)
AST: 22 U/L (ref 10–35)
Albumin: 4.2 g/dL (ref 3.6–5.1)
BUN: 18 mg/dL (ref 7–25)
CO2: 27 mmol/L (ref 20–32)
Calcium: 10.2 mg/dL (ref 8.6–10.4)
Chloride: 107 mmol/L (ref 98–110)
Creat: 0.74 mg/dL (ref 0.60–0.93)
GFR, EST NON AFRICAN AMERICAN: 80 mL/min/{1.73_m2} (ref >=60)
GFR, Est African American: 93 mL/min/{1.73_m2} (ref >=60)
GLOBULIN: 2.4 g/dL (ref 1.9–3.7)
Glucose, Bld: 85 mg/dL (ref 65–99)
POTASSIUM: 4.3 mmol/L (ref 3.5–5.3)
SODIUM: 141 mmol/L (ref 135–146)
Total Bilirubin: 0.5 mg/dL (ref 0.2–1.2)
Total Protein: 6.6 g/dL (ref 6.1–8.1)

## 2016-12-30 LAB — CBC WITH DIFFERENTIAL/PLATELET
BASOS PCT: 0.2 %
Basophils Absolute: 12 cells/uL (ref 0–200)
Eosinophils Absolute: 52 cells/uL (ref 15–500)
Eosinophils Relative: 0.9 %
HCT: 44.4 % (ref 35.0–45.0)
Hemoglobin: 15.3 g/dL (ref 11.7–15.5)
Lymphs Abs: 1601 cells/uL (ref 850–3900)
MCH: 32.8 pg (ref 27.0–33.0)
MCHC: 34.5 g/dL (ref 32.0–36.0)
MCV: 95.1 fL (ref 80.0–100.0)
MONOS PCT: 8.5 %
MPV: 11.6 fL (ref 7.5–12.5)
Neutro Abs: 3642 cells/uL (ref 1500–7800)
Neutrophils Relative %: 62.8 %
PLATELETS: 155 10*3/uL (ref 140–400)
RBC: 4.67 10*6/uL (ref 3.80–5.10)
RDW: 12.6 % (ref 11.0–15.0)
TOTAL LYMPHOCYTE: 27.6 %
WBC mixed population: 493 cells/uL (ref 200–950)
WBC: 5.8 10*3/uL (ref 3.8–10.8)

## 2016-12-30 NOTE — Progress Notes (Signed)
WNL

## 2017-02-16 DIAGNOSIS — G4700 Insomnia, unspecified: Secondary | ICD-10-CM | POA: Diagnosis not present

## 2017-02-16 DIAGNOSIS — M810 Age-related osteoporosis without current pathological fracture: Secondary | ICD-10-CM | POA: Diagnosis not present

## 2017-02-16 DIAGNOSIS — Z79899 Other long term (current) drug therapy: Secondary | ICD-10-CM | POA: Diagnosis not present

## 2017-02-16 DIAGNOSIS — F419 Anxiety disorder, unspecified: Secondary | ICD-10-CM | POA: Diagnosis not present

## 2017-02-16 DIAGNOSIS — F321 Major depressive disorder, single episode, moderate: Secondary | ICD-10-CM | POA: Diagnosis not present

## 2017-02-16 DIAGNOSIS — M059 Rheumatoid arthritis with rheumatoid factor, unspecified: Secondary | ICD-10-CM | POA: Diagnosis not present

## 2017-02-26 DIAGNOSIS — C44319 Basal cell carcinoma of skin of other parts of face: Secondary | ICD-10-CM | POA: Diagnosis not present

## 2017-02-26 DIAGNOSIS — D485 Neoplasm of uncertain behavior of skin: Secondary | ICD-10-CM | POA: Diagnosis not present

## 2017-02-26 DIAGNOSIS — Z23 Encounter for immunization: Secondary | ICD-10-CM | POA: Diagnosis not present

## 2017-03-16 DIAGNOSIS — Z1231 Encounter for screening mammogram for malignant neoplasm of breast: Secondary | ICD-10-CM | POA: Diagnosis not present

## 2017-04-14 DIAGNOSIS — C44310 Basal cell carcinoma of skin of unspecified parts of face: Secondary | ICD-10-CM | POA: Diagnosis not present

## 2017-04-24 ENCOUNTER — Other Ambulatory Visit: Payer: Self-pay | Admitting: Rheumatology

## 2017-04-24 NOTE — Telephone Encounter (Signed)
Last Visit: 12/29/16 Next Visit: 06/02/17 Labs: 12/29/16 WNL  Left message to advise patient she is due for labs.   Okay to refill 30 day supply per Dr. Estanislado Pandy

## 2017-04-27 ENCOUNTER — Other Ambulatory Visit: Payer: Self-pay

## 2017-04-27 ENCOUNTER — Other Ambulatory Visit: Payer: Self-pay | Admitting: *Deleted

## 2017-04-27 DIAGNOSIS — Z79899 Other long term (current) drug therapy: Secondary | ICD-10-CM

## 2017-04-27 LAB — CBC WITH DIFFERENTIAL/PLATELET
BASOS PCT: 0.3 %
Basophils Absolute: 22 cells/uL (ref 0–200)
EOS ABS: 52 {cells}/uL (ref 15–500)
Eosinophils Relative: 0.7 %
HCT: 46.2 % — ABNORMAL HIGH (ref 35.0–45.0)
HEMOGLOBIN: 15.7 g/dL — AB (ref 11.7–15.5)
LYMPHS ABS: 2087 {cells}/uL (ref 850–3900)
MCH: 31.9 pg (ref 27.0–33.0)
MCHC: 34 g/dL (ref 32.0–36.0)
MCV: 93.9 fL (ref 80.0–100.0)
MONOS PCT: 8.3 %
MPV: 11.8 fL (ref 7.5–12.5)
NEUTROS ABS: 4625 {cells}/uL (ref 1500–7800)
Neutrophils Relative %: 62.5 %
Platelets: 167 10*3/uL (ref 140–400)
RBC: 4.92 10*6/uL (ref 3.80–5.10)
RDW: 12.1 % (ref 11.0–15.0)
Total Lymphocyte: 28.2 %
WBC mixed population: 614 cells/uL (ref 200–950)
WBC: 7.4 10*3/uL (ref 3.8–10.8)

## 2017-04-27 LAB — COMPLETE METABOLIC PANEL WITH GFR
AG RATIO: 1.6 (calc) (ref 1.0–2.5)
ALT: 15 U/L (ref 6–29)
AST: 20 U/L (ref 10–35)
Albumin: 4.1 g/dL (ref 3.6–5.1)
Alkaline phosphatase (APISO): 71 U/L (ref 33–130)
BUN: 17 mg/dL (ref 7–25)
CALCIUM: 10.4 mg/dL (ref 8.6–10.4)
CO2: 29 mmol/L (ref 20–32)
CREATININE: 0.78 mg/dL (ref 0.60–0.93)
Chloride: 103 mmol/L (ref 98–110)
GFR, EST NON AFRICAN AMERICAN: 75 mL/min/{1.73_m2} (ref >=60)
GFR, Est African American: 87 mL/min/{1.73_m2} (ref >=60)
GLUCOSE: 82 mg/dL (ref 65–99)
Globulin: 2.6 g/dL (calc) (ref 1.9–3.7)
POTASSIUM: 4.3 mmol/L (ref 3.5–5.3)
Sodium: 140 mmol/L (ref 135–146)
Total Bilirubin: 0.5 mg/dL (ref 0.2–1.2)
Total Protein: 6.7 g/dL (ref 6.1–8.1)

## 2017-04-28 NOTE — Progress Notes (Signed)
WNL

## 2017-05-20 NOTE — Progress Notes (Signed)
Office Visit Note  Patient: Joy Fernandez             Date of Birth: 1946/02/17           MRN: [id number]             PCP: Ernestene Kiel, MD Referring: Ernestene Kiel, MD Visit Date: 06/02/2017 Occupation: @GUAROCC @    Subjective:  Medication Management   History of Present Illness: Joy Fernandez is a 72 y.o. female with history of rheumatoid arthritis, osteoarthritis and osteoporosis.  She has reduced her methotrexate from 7-6 tablets p.o. weekly.  She continues to do well without any joint pain or joint swelling.  She has been taking Boniva which she has been tolerating well for osteoporosis.  Activities of Daily Living:  Patient reports morning stiffness for 10 minutes.   Patient Denies nocturnal pain.  Difficulty dressing/grooming: Denies Difficulty climbing stairs: Denies Difficulty getting out of chair: Denies Difficulty using hands for taps, buttons, cutlery, and/or writing: Denies   Review of Systems  Constitutional: Negative for fatigue, night sweats, weight gain, weight loss and weakness.  HENT: Negative for mouth sores, trouble swallowing, trouble swallowing, mouth dryness and nose dryness.   Eyes: Negative for pain, redness, visual disturbance and dryness.  Respiratory: Negative for cough, shortness of breath and difficulty breathing.   Cardiovascular: Negative for chest pain, palpitations, hypertension, irregular heartbeat and swelling in legs/feet.  Gastrointestinal: Negative for abdominal pain, blood in stool, constipation, diarrhea and nausea.  Endocrine: Negative for increased urination.  Genitourinary: Negative for pelvic pain and vaginal dryness.  Musculoskeletal: Positive for morning stiffness. Negative for arthralgias, joint pain, joint swelling, myalgias, muscle weakness, muscle tenderness and myalgias.  Skin: Negative for color change, rash, hair loss, redness, skin tightness, ulcers and sensitivity to sunlight.  Allergic/Immunologic: Negative for  susceptible to infections.  Neurological: Negative for dizziness, numbness, memory loss and night sweats.  Hematological: Negative for bruising/bleeding tendency and swollen glands.  Psychiatric/Behavioral: Negative for depressed mood and sleep disturbance. The patient is nervous/anxious.     PMFS History:  Patient Active Problem List   Diagnosis Date Noted  . History of depression 06/06/2016  . History of gastroesophageal reflux (GERD) 06/06/2016  . Rheumatoid arthritis of multiple sites with negative rheumatoid factor ([place]) 05/30/2016  . Primary osteoarthritis of both feet 05/30/2016  . Primary osteoarthritis of both hands 05/30/2016  . High risk medication use 05/30/2016  . Age-related osteoporosis without current pathological fracture 05/30/2016  . Idiopathic thrombocytopenic purpura ([place]) 02/24/2011    Past Medical History:  Diagnosis Date  . Arthritis   . Depression   . Insomnia   . ITP (idiopathic thrombocytopenic purpura) 02/24/2011  . Osteoporosis     Family History  Problem Relation Age of Onset  . Hypertension Mother   . Heart disease Father   . Hypertension Brother   . Depression Daughter   . Depression Son    Past Surgical History:  Procedure Laterality Date  . ABDOMINAL HYSTERECTOMY    . BREAST SURGERY Left    lumpectomy   . CHOLECYSTECTOMY    . MOHS SURGERY     x4   Social History   Social History Narrative  . Not on file     Objective: Vital Signs: BP 118/70 (BP Location: Left Arm, Patient Position: Sitting, Cuff Size: Normal)   Pulse 69   Resp 14   Ht 5' 3.5" (1.613 m)   Wt 155 lb 8 oz (70.5 kg)   BMI 27.11 kg/m  Physical Exam  Constitutional: She is oriented to person, place, and time. She appears well-developed and well-nourished.  HENT:  Head: Normocephalic and atraumatic.  Eyes: Conjunctivae and EOM are normal.  Neck: Normal range of motion.  Cardiovascular: Normal rate, regular rhythm, normal heart sounds and intact distal  pulses.  Pulmonary/Chest: Effort normal and breath sounds normal.  Abdominal: Soft. Bowel sounds are normal.  Lymphadenopathy:    She has no cervical adenopathy.  Neurological: She is alert and oriented to person, place, and time.  Skin: Skin is warm and dry. Capillary refill takes less than 2 seconds.  Psychiatric: She has a normal mood and affect. Her behavior is normal.  Nursing note and vitals reviewed.    Musculoskeletal Exam: C-spine thoracic lumbar spine good range of motion.  Shoulder joints elbow joints wrist joint MCPs PIPs DIPs with good range of motion.  She had no MCP synovitis.  She has thickening of PIP/DIP joints in her hands and feet consistent with osteoarthritis.  Hip joints and knee joints are good range of motion with no swelling.  CDAI Exam: CDAI Homunculus Exam:   Joint Counts:  CDAI Tender Joint count: 0 CDAI Swollen Joint count: 0  Global Assessments:  Patient Global Assessment: 2 Provider Global Assessment: 2  CDAI Calculated Score: 4    Investigation: No additional findings. CBC Latest Ref Rng & Units 04/27/2017 12/29/2016 07/24/2016  WBC 3.8 - 10.8 Thousand/uL 7.4 5.8 6.8  Hemoglobin 11.7 - 15.5 g/dL 15.7(H) 15.3 15.4  Hematocrit 35.0 - 45.0 % 46.2(H) 44.4 46.1(H)  Platelets 140 - 400 Thousand/uL 167 155 178   CMP Latest Ref Rng & Units 04/27/2017 12/29/2016 07/24/2016  Glucose 65 - 99 mg/dL 82 85 50(L)  BUN 7 - 25 mg/dL 17 18 14   Creatinine 0.60 - 0.93 mg/dL 0.78 0.74 0.80  Sodium 135 - 146 mmol/L 140 141 142  Potassium 3.5 - 5.3 mmol/L 4.3 4.3 5.1  Chloride 98 - 110 mmol/L 103 107 107  CO2 20 - 32 mmol/L 29 27 28   Calcium 8.6 - 10.4 mg/dL 10.4 10.2 10.0  Total Protein 6.1 - 8.1 g/dL 6.7 6.6 6.2  Total Bilirubin 0.2 - 1.2 mg/dL 0.5 0.5 0.4  Alkaline Phos 33 - 130 U/L - - 73  AST 10 - 35 U/L 20 22 25   ALT 6 - 29 U/L 15 17 16     Imaging: Xr Foot 2 Views Left  Result Date: 06/02/2017 First MTP, all PIP and DIP narrowing was noted.  No MTP or  erosive changes were noted.  No intertarsal joint space narrowing was noted.  Small calcaneal spur was noted. Impression: These findings are consistent with osteoarthritis of the foot.  Xr Foot 2 Views Right  Result Date: 06/02/2017 First MTP, all PIP and DIP narrowing was noted.  No MTP or erosive changes were noted.  No intertarsal joint space narrowing was noted.  Small calcaneal spur was noted. Impression: These findings are consistent with osteoarthritis of the foot.  Xr Hand 2 View Left  Result Date: 06/02/2017 Juxta-articular osteopenia was noted.  Second and third MCP severe narrowing was noted.  PIP and DIP narrowing was noted.  Intercarpal radiocarpal joint space narrowing with cystic changes in the carpal bones were noted. Impression: These findings are consistent with osteoarthritis and rheumatoid arthritis overlap.  Xr Hand 2 View Right  Result Date: 06/02/2017 Second and third severe MCP narrowing was noted.  Juxta-articular osteopenia was noted.  No erosive changes were noted.  Intercarpal and radiocarpal  joint space narrowing was noted.  Some cystic changes were noted in the carpal bones.  PIP and DIP narrowing was noted. Impression: These findings are consistent with rheumatoid arthritis and osteoarthritis overlap.   Speciality Comments: No specialty comments available.    Procedures:  No procedures performed Allergies: Other and Codeine   Assessment / Plan:     Visit Diagnoses: Rheumatoid arthritis of multiple sites with negative rheumatoid factor ([place]) -patient has reduced her methotrexate to 6 tablets p.o. weekly without any increased joint pain or joint swelling.  She does not have any synovitis on examination.  She has intermittent arthralgias in her joints.  I will check her x-rays today to review.  Plan: XR Hand 2 View Right, XR Hand 2 View Left, XR Foot 2 Views Right, XR Foot 2 Views Left  High risk medication use - Methotrexate 6 tablets every every week, folic acid 2  mg by mouth daily - Plan: CBC with Differential/Platelet, COMPLETE METABOLIC PANEL WITH GFR.  Her labs have been stable.  We will continue to monitor labs every 3 months.  Primary osteoarthritis of both hands: Joint protection muscle strengthening discussed.  Primary osteoarthritis of both feet: Proper fitting shoes were discussed.  Age-related osteoporosis without current pathological fracture - on Boniva , DEXA 12/25/2015 T score -3.31 October 2013 T score was -3.5.  She will have repeat bone density in September 2019.  Other medical problems are listed as follows:  Idiopathic thrombocytopenic purpura ([place])  History of depression  History of gastroesophageal reflux (GERD)    Orders: Orders Placed This Encounter  Procedures  . XR Hand 2 View Right  . XR Hand 2 View Left  . XR Foot 2 Views Right  . XR Foot 2 Views Left  . CBC with Differential/Platelet  . COMPLETE METABOLIC PANEL WITH GFR   No orders of the defined types were placed in this encounter.   Face-to-face time spent with patient was 30 minutes.  Greater than 50% of time was spent in counseling and coordination of care.  Follow-Up Instructions: Return in about 5 months (around 11/02/2017) for Rheumatoid arthritis, Osteoarthritis, Osteoporosis.   Bo Merino, MD  Note - This record has been created using Editor, commissioning.  Chart creation errors have been sought, but may not always  have been located. Such creation errors do not reflect on  the standard of medical care.

## 2017-06-02 ENCOUNTER — Ambulatory Visit (INDEPENDENT_AMBULATORY_CARE_PROVIDER_SITE_OTHER): Payer: PPO

## 2017-06-02 ENCOUNTER — Encounter: Payer: Self-pay | Admitting: Rheumatology

## 2017-06-02 ENCOUNTER — Ambulatory Visit: Payer: PPO | Admitting: Rheumatology

## 2017-06-02 ENCOUNTER — Ambulatory Visit (INDEPENDENT_AMBULATORY_CARE_PROVIDER_SITE_OTHER): Payer: Self-pay

## 2017-06-02 VITALS — BP 118/70 | HR 69 | Resp 14 | Ht 63.5 in | Wt 155.5 lb

## 2017-06-02 DIAGNOSIS — D693 Immune thrombocytopenic purpura: Secondary | ICD-10-CM

## 2017-06-02 DIAGNOSIS — M19072 Primary osteoarthritis, left ankle and foot: Secondary | ICD-10-CM

## 2017-06-02 DIAGNOSIS — M810 Age-related osteoporosis without current pathological fracture: Secondary | ICD-10-CM

## 2017-06-02 DIAGNOSIS — M19042 Primary osteoarthritis, left hand: Secondary | ICD-10-CM | POA: Diagnosis not present

## 2017-06-02 DIAGNOSIS — Z8659 Personal history of other mental and behavioral disorders: Secondary | ICD-10-CM | POA: Diagnosis not present

## 2017-06-02 DIAGNOSIS — M19041 Primary osteoarthritis, right hand: Secondary | ICD-10-CM | POA: Diagnosis not present

## 2017-06-02 DIAGNOSIS — Z8719 Personal history of other diseases of the digestive system: Secondary | ICD-10-CM | POA: Diagnosis not present

## 2017-06-02 DIAGNOSIS — Z79899 Other long term (current) drug therapy: Secondary | ICD-10-CM

## 2017-06-02 DIAGNOSIS — M0609 Rheumatoid arthritis without rheumatoid factor, multiple sites: Secondary | ICD-10-CM

## 2017-06-02 DIAGNOSIS — M19071 Primary osteoarthritis, right ankle and foot: Secondary | ICD-10-CM | POA: Diagnosis not present

## 2017-06-02 NOTE — Patient Instructions (Signed)
Standing Labs We placed an order today for your standing lab work.    Please come back and get your standing labs in May and every 3 month.  We have open lab Monday through Friday from 8:30-11:30 AM and 1:30-4 PM at the office of Dr. Bo Merino.   The office is located at [address], [place], [place], [place] [address] No appointment is necessary.   Labs are drawn by [facility].  You may receive a bill from [facility] for your lab work. If you have any questions regarding directions or hours of operation,  please call [phone].

## 2017-06-25 DIAGNOSIS — H2513 Age-related nuclear cataract, bilateral: Secondary | ICD-10-CM | POA: Diagnosis not present

## 2017-07-01 ENCOUNTER — Other Ambulatory Visit: Payer: Self-pay | Admitting: Rheumatology

## 2017-07-01 NOTE — Telephone Encounter (Signed)
Last Visit: 06/02/17 Next Visit: 11/09/17 Labs: 04/27/17 WNL  Okay to refill per Dr. Estanislado Pandy

## 2017-08-04 DIAGNOSIS — D485 Neoplasm of uncertain behavior of skin: Secondary | ICD-10-CM | POA: Diagnosis not present

## 2017-08-04 DIAGNOSIS — L570 Actinic keratosis: Secondary | ICD-10-CM | POA: Diagnosis not present

## 2017-08-24 DIAGNOSIS — N3000 Acute cystitis without hematuria: Secondary | ICD-10-CM | POA: Diagnosis not present

## 2017-08-24 DIAGNOSIS — Z79899 Other long term (current) drug therapy: Secondary | ICD-10-CM | POA: Diagnosis not present

## 2017-08-25 DIAGNOSIS — F419 Anxiety disorder, unspecified: Secondary | ICD-10-CM | POA: Diagnosis not present

## 2017-08-25 DIAGNOSIS — E559 Vitamin D deficiency, unspecified: Secondary | ICD-10-CM | POA: Diagnosis not present

## 2017-08-25 DIAGNOSIS — R12 Heartburn: Secondary | ICD-10-CM | POA: Diagnosis not present

## 2017-08-25 DIAGNOSIS — R1310 Dysphagia, unspecified: Secondary | ICD-10-CM | POA: Diagnosis not present

## 2017-08-25 DIAGNOSIS — E781 Pure hyperglyceridemia: Secondary | ICD-10-CM | POA: Diagnosis not present

## 2017-08-27 ENCOUNTER — Other Ambulatory Visit: Payer: Self-pay | Admitting: *Deleted

## 2017-08-27 NOTE — Progress Notes (Signed)
error 

## 2017-09-08 DIAGNOSIS — R1310 Dysphagia, unspecified: Secondary | ICD-10-CM | POA: Diagnosis not present

## 2017-09-08 DIAGNOSIS — K449 Diaphragmatic hernia without obstruction or gangrene: Secondary | ICD-10-CM | POA: Diagnosis not present

## 2017-10-06 DIAGNOSIS — D1801 Hemangioma of skin and subcutaneous tissue: Secondary | ICD-10-CM | POA: Diagnosis not present

## 2017-10-06 DIAGNOSIS — Z85828 Personal history of other malignant neoplasm of skin: Secondary | ICD-10-CM | POA: Diagnosis not present

## 2017-10-06 DIAGNOSIS — D224 Melanocytic nevi of scalp and neck: Secondary | ICD-10-CM | POA: Diagnosis not present

## 2017-10-06 DIAGNOSIS — D2261 Melanocytic nevi of right upper limb, including shoulder: Secondary | ICD-10-CM | POA: Diagnosis not present

## 2017-10-06 DIAGNOSIS — Z808 Family history of malignant neoplasm of other organs or systems: Secondary | ICD-10-CM | POA: Diagnosis not present

## 2017-10-06 DIAGNOSIS — L570 Actinic keratosis: Secondary | ICD-10-CM | POA: Diagnosis not present

## 2017-10-06 DIAGNOSIS — L723 Sebaceous cyst: Secondary | ICD-10-CM | POA: Diagnosis not present

## 2017-10-15 ENCOUNTER — Other Ambulatory Visit: Payer: Self-pay | Admitting: Rheumatology

## 2017-10-15 NOTE — Telephone Encounter (Addendum)
Last Visit: 06/02/17 Next Visit: 11/09/17 Labs: 04/27/17 WNL  Left message to advise patient she is due for labs  Okay to refill 30 day supply MTX?

## 2017-10-15 NOTE — Telephone Encounter (Signed)
Patient needs labs prior to methotrexate refill.

## 2017-10-26 NOTE — Progress Notes (Signed)
Office Visit Note  Patient: Joy Fernandez             Date of Birth: Sep 18, 1945           MRN: [id number]             PCP: Ernestene Kiel, MD Referring: Ernestene Kiel, MD Visit Date: 11/09/2017 Occupation: @GUAROCC @  Subjective:  Pain in left hand and left shoulder.   History of Present Illness: Joy Fernandez is a 72 y.o. female with history of seronegative rheumatoid arthritis, osteoarthritis, and osteoporosis.  Patient is on methotrexate 6 tablets by mouth once weekly and folic acid 2 mg daily.  She states she had been doing well without any joint pain or joint swelling.  She states yesterday while steps she tripped and fell and she landed on her left shoulder and left hand.  She has been having some discomfort in her left fifth finger.  Activities of Daily Living:  Patient reports morning stiffness for 5 minutes.   Patient Denies nocturnal pain.  Difficulty dressing/grooming: Denies Difficulty climbing stairs: Denies Difficulty getting out of chair: Denies Difficulty using hands for taps, buttons, cutlery, and/or writing: Denies  Review of Systems  Constitutional: Negative for fatigue, night sweats, weight gain and weight loss.  HENT: Negative for mouth sores, trouble swallowing, trouble swallowing, mouth dryness and nose dryness.   Eyes: Negative for pain, redness, visual disturbance and dryness.  Respiratory: Negative for cough, hemoptysis, shortness of breath and difficulty breathing.   Cardiovascular: Negative for chest pain, palpitations, hypertension, irregular heartbeat and swelling in legs/feet.  Gastrointestinal: Negative for blood in stool, constipation and diarrhea.  Endocrine: Negative for increased urination.  Genitourinary: Negative for difficulty urinating, painful urination and vaginal dryness.  Musculoskeletal: Positive for muscle weakness and morning stiffness. Negative for arthralgias, joint pain, joint swelling, myalgias, muscle tenderness and myalgias.    Skin: Negative for color change, pallor, rash, hair loss, nodules/bumps, skin tightness, ulcers and sensitivity to sunlight.  Allergic/Immunologic: Negative for susceptible to infections.  Neurological: Negative for dizziness, numbness, headaches, memory loss, night sweats and weakness.  Hematological: Negative for bruising/bleeding tendency and swollen glands.  Psychiatric/Behavioral: Negative for depressed mood and sleep disturbance. The patient is not nervous/anxious.     PMFS History:  Patient Active Problem List   Diagnosis Date Noted  . History of depression 06/06/2016  . History of gastroesophageal reflux (GERD) 06/06/2016  . Rheumatoid arthritis of multiple sites with negative rheumatoid factor ([place]) 05/30/2016  . Primary osteoarthritis of both feet 05/30/2016  . Primary osteoarthritis of both hands 05/30/2016  . High risk medication use 05/30/2016  . Age-related osteoporosis without current pathological fracture 05/30/2016  . Idiopathic thrombocytopenic purpura ([place]) 02/24/2011    Past Medical History:  Diagnosis Date  . Arthritis   . Depression   . Insomnia   . ITP (idiopathic thrombocytopenic purpura) 02/24/2011  . Osteoporosis     Family History  Problem Relation Age of Onset  . Hypertension Mother   . Heart disease Father   . Hypertension Brother   . Depression Daughter   . Depression Son    Past Surgical History:  Procedure Laterality Date  . ABDOMINAL HYSTERECTOMY    . BREAST SURGERY Left    lumpectomy   . CHOLECYSTECTOMY    . MOHS SURGERY     x4   Social History   Social History Narrative  . Not on file    Objective: Vital Signs: BP 110/65 (BP Location: Left Arm, Patient Position:  Sitting, Cuff Size: Normal)   Pulse 67   Resp 16   Ht 5' 3.5" (1.613 m)   Wt 154 lb 12.8 oz (70.2 kg)   BMI 26.99 kg/m    Physical Exam  Constitutional: She is oriented to person, place, and time. She appears well-developed and well-nourished.  HENT:  Head:  Normocephalic and atraumatic.  Eyes: Conjunctivae and EOM are normal.  Neck: Normal range of motion.  Cardiovascular: Normal rate, regular rhythm, normal heart sounds and intact distal pulses.  Pulmonary/Chest: Effort normal and breath sounds normal.  Abdominal: Soft. Bowel sounds are normal.  Lymphadenopathy:    She has no cervical adenopathy.  Neurological: She is alert and oriented to person, place, and time.  Skin: Skin is warm and dry. Capillary refill takes less than 2 seconds.  Psychiatric: She has a normal mood and affect. Her behavior is normal.  Nursing note and vitals reviewed.    Musculoskeletal Exam: C-spine thoracic lumbar spine good range of motion.  Shoulder joints elbow joints wrist joint MCPs PIPs DIPs were in good range of motion.  She has some tenderness over her left fifth digit without any swelling.  Hip joints knee joints ankles MTPs PIPs been good range of motion with no synovitis.  CDAI Exam: CDAI Homunculus Exam:   Tenderness:  Left hand: 5th PIP  Joint Counts:  CDAI Tender Joint count: 1 CDAI Swollen Joint count: 0  Global Assessments:  Patient Global Assessment: 2 Provider Global Assessment: 2  CDAI Calculated Score: 5   Investigation: No additional findings.  Imaging: No results found.  Recent Labs: Lab Results  Component Value Date   WBC 7.4 04/27/2017   HGB 15.7 (H) 04/27/2017   PLT 167 04/27/2017   NA 140 04/27/2017   K 4.3 04/27/2017   CL 103 04/27/2017   CO2 29 04/27/2017   GLUCOSE 82 04/27/2017   BUN 17 04/27/2017   CREATININE 0.78 04/27/2017   BILITOT 0.5 04/27/2017   ALKPHOS 73 07/24/2016   AST 20 04/27/2017   ALT 15 04/27/2017   PROT 6.7 04/27/2017   ALBUMIN 3.9 07/24/2016   CALCIUM 10.4 04/27/2017   GFRAA 87 04/27/2017    Speciality Comments: No specialty comments available.  Procedures:  No procedures performed Allergies: Other and Codeine   Assessment / Plan:     Visit Diagnoses: Rheumatoid arthritis of  multiple sites with negative rheumatoid factor ([facility])-patient had no synovitis on examination today.  She continues to do well on methotrexate 6 tablets/week.  She states due to her husband's illness she could not get labs.  We will get her labs today.  And will refill her methotrexate prescription after the lab results.  High risk medication use - Methotrexate 6 tablets every every week, folic acid 2 mg by mouth daily - Plan: CBC with Differential/Platelet, COMPLETE METABOLIC PANEL WITH GFR today and then every 3 months to monitor for drug toxicity.  Primary osteoarthritis of both hands-joint protection muscle strengthening was discussed.  Primary osteoarthritis of both feet-she has been using proper fitting shoes which has been helpful.  Age-related osteoporosis without current pathological fracture - on Boniva , DEXA 12/25/2015 T score -3.31 October 2013 T score was -3.5 - Plan: DG DXA FRACTURE ASSESSMENT will be done in October 2019.  History of recent fall-patient tripped and fell from the stairs.  She states she had some discomfort in her left shoulder and left hand.  There was no point tenderness on examination today.  History of depression  Idiopathic thrombocytopenic purpura ([facility])  History of gastroesophageal reflux (GERD)   Orders: Orders Placed This Encounter  Procedures  . DG DXA FRACTURE ASSESSMENT  . CBC with Differential/Platelet  . COMPLETE METABOLIC PANEL WITH GFR   No orders of the defined types were placed in this encounter.    Follow-Up Instructions: Return in about 5 months (around 04/11/2018) for Rheumatoid arthritis, Osteoarthritis, Osteoporosis.   Bo Merino, MD  Note - This record has been created using Editor, commissioning.  Chart creation errors have been sought, but may not always  have been located. Such creation errors do not reflect on  the standard of medical care.

## 2017-11-09 ENCOUNTER — Ambulatory Visit: Payer: PPO | Admitting: Rheumatology

## 2017-11-09 ENCOUNTER — Encounter: Payer: Self-pay | Admitting: Rheumatology

## 2017-11-09 ENCOUNTER — Telehealth: Payer: Self-pay | Admitting: *Deleted

## 2017-11-09 VITALS — BP 110/65 | HR 67 | Resp 16 | Ht 63.5 in | Wt 154.8 lb

## 2017-11-09 DIAGNOSIS — M19041 Primary osteoarthritis, right hand: Secondary | ICD-10-CM | POA: Diagnosis not present

## 2017-11-09 DIAGNOSIS — M19071 Primary osteoarthritis, right ankle and foot: Secondary | ICD-10-CM

## 2017-11-09 DIAGNOSIS — M19042 Primary osteoarthritis, left hand: Secondary | ICD-10-CM | POA: Diagnosis not present

## 2017-11-09 DIAGNOSIS — M19072 Primary osteoarthritis, left ankle and foot: Secondary | ICD-10-CM | POA: Diagnosis not present

## 2017-11-09 DIAGNOSIS — Z8659 Personal history of other mental and behavioral disorders: Secondary | ICD-10-CM | POA: Diagnosis not present

## 2017-11-09 DIAGNOSIS — Z79899 Other long term (current) drug therapy: Secondary | ICD-10-CM | POA: Diagnosis not present

## 2017-11-09 DIAGNOSIS — Z9181 History of falling: Secondary | ICD-10-CM

## 2017-11-09 DIAGNOSIS — Z8719 Personal history of other diseases of the digestive system: Secondary | ICD-10-CM

## 2017-11-09 DIAGNOSIS — D693 Immune thrombocytopenic purpura: Secondary | ICD-10-CM | POA: Diagnosis not present

## 2017-11-09 DIAGNOSIS — M810 Age-related osteoporosis without current pathological fracture: Secondary | ICD-10-CM | POA: Diagnosis not present

## 2017-11-09 DIAGNOSIS — M0609 Rheumatoid arthritis without rheumatoid factor, multiple sites: Secondary | ICD-10-CM | POA: Diagnosis not present

## 2017-11-09 NOTE — Patient Instructions (Signed)
Standing Labs We placed an order today for your standing lab work.    Please come back and get your standing labs in November and every 3 months   We have open lab Monday through Friday from 8:30-11:30 AM and 1:30-4:00 PM  at the office of Dr. Yann Biehn.   You may experience shorter wait times on Monday and Friday afternoons. The office is located at [address], [address], [place], [place] [address] No appointment is necessary.   Labs are drawn by [facility].  You may receive a bill from [facility] for your lab work. If you have any questions regarding directions or hours of operation,  please call [phone].     

## 2017-11-09 NOTE — Telephone Encounter (Signed)
Please refill MTX after labs.

## 2017-11-10 LAB — COMPLETE METABOLIC PANEL WITH GFR
AG RATIO: 1.7 (calc) (ref 1.0–2.5)
ALT: 13 U/L (ref 6–29)
AST: 19 U/L (ref 10–35)
Albumin: 4 g/dL (ref 3.6–5.1)
Alkaline phosphatase (APISO): 78 U/L (ref 33–130)
BILIRUBIN TOTAL: 0.4 mg/dL (ref 0.2–1.2)
BUN: 12 mg/dL (ref 7–25)
CALCIUM: 10.3 mg/dL (ref 8.6–10.4)
CHLORIDE: 106 mmol/L (ref 98–110)
CO2: 29 mmol/L (ref 20–32)
Creat: 0.77 mg/dL (ref 0.60–0.93)
GFR, Est African American: 88 mL/min/{1.73_m2} (ref >=60)
GFR, Est Non African American: 76 mL/min/{1.73_m2} (ref >=60)
Globulin: 2.4 g/dL (calc) (ref 1.9–3.7)
Glucose, Bld: 94 mg/dL (ref 65–99)
POTASSIUM: 4.4 mmol/L (ref 3.5–5.3)
Sodium: 142 mmol/L (ref 135–146)
Total Protein: 6.4 g/dL (ref 6.1–8.1)

## 2017-11-10 LAB — CBC WITH DIFFERENTIAL/PLATELET
BASOS PCT: 0.3 %
Basophils Absolute: 18 cells/uL (ref 0–200)
Eosinophils Absolute: 79 cells/uL (ref 15–500)
Eosinophils Relative: 1.3 %
HEMATOCRIT: 46.1 % — AB (ref 35.0–45.0)
Hemoglobin: 15.6 g/dL — ABNORMAL HIGH (ref 11.7–15.5)
LYMPHS ABS: 1787 {cells}/uL (ref 850–3900)
MCH: 32.7 pg (ref 27.0–33.0)
MCHC: 33.8 g/dL (ref 32.0–36.0)
MCV: 96.6 fL (ref 80.0–100.0)
MPV: 12 fL (ref 7.5–12.5)
Monocytes Relative: 11.9 %
Neutro Abs: 3489 cells/uL (ref 1500–7800)
Neutrophils Relative %: 57.2 %
Platelets: 145 10*3/uL (ref 140–400)
RBC: 4.77 10*6/uL (ref 3.80–5.10)
RDW: 13 % (ref 11.0–15.0)
Total Lymphocyte: 29.3 %
WBC: 6.1 10*3/uL (ref 3.8–10.8)
WBCMIX: 726 {cells}/uL (ref 200–950)

## 2017-11-10 MED ORDER — METHOTREXATE SODIUM 2.5 MG PO TABS: ORAL_TABLET | ORAL | 0 refills | Status: DC

## 2017-11-10 NOTE — Addendum Note (Signed)
Addended by: Francis Gaines C on: 11/10/2017 10:20 AM   Modules accepted: Orders

## 2017-11-10 NOTE — Telephone Encounter (Signed)
Last visit: 11/09/2017 Next visit: 04/19/2018 Labs: 11/09/2017 Hgb and hct stable. All other labs are WNL.  Okay to refill per Dr. Estanislado Pandy.

## 2018-01-18 ENCOUNTER — Encounter: Payer: Self-pay | Admitting: Rheumatology

## 2018-01-18 DIAGNOSIS — M8589 Other specified disorders of bone density and structure, multiple sites: Secondary | ICD-10-CM | POA: Diagnosis not present

## 2018-01-18 DIAGNOSIS — M810 Age-related osteoporosis without current pathological fracture: Secondary | ICD-10-CM | POA: Diagnosis not present

## 2018-01-20 ENCOUNTER — Telehealth: Payer: Self-pay | Admitting: *Deleted

## 2018-01-20 NOTE — Telephone Encounter (Signed)
Bone Density Scan- 01/18/18 T-Score -2.8 Patient on [place] Per Dr. Estanislado Pandy Improved Continue current treatment. Repeat bone density in 2 years.

## 2018-01-28 NOTE — Telephone Encounter (Signed)
Patient advised Improved Continue current treatment. Repeat bone density in 2 years. Patient verbalized understanding.

## 2018-02-02 DIAGNOSIS — Z0000 Encounter for general adult medical examination without abnormal findings: Secondary | ICD-10-CM | POA: Diagnosis not present

## 2018-02-02 DIAGNOSIS — Z1331 Encounter for screening for depression: Secondary | ICD-10-CM | POA: Diagnosis not present

## 2018-02-02 DIAGNOSIS — E663 Overweight: Secondary | ICD-10-CM | POA: Diagnosis not present

## 2018-02-02 DIAGNOSIS — Z6827 Body mass index (BMI) 27.0-27.9, adult: Secondary | ICD-10-CM | POA: Diagnosis not present

## 2018-02-02 DIAGNOSIS — Z23 Encounter for immunization: Secondary | ICD-10-CM | POA: Diagnosis not present

## 2018-02-02 DIAGNOSIS — R1011 Right upper quadrant pain: Secondary | ICD-10-CM | POA: Diagnosis not present

## 2018-02-02 DIAGNOSIS — Z1339 Encounter for screening examination for other mental health and behavioral disorders: Secondary | ICD-10-CM | POA: Diagnosis not present

## 2018-02-19 DIAGNOSIS — R1011 Right upper quadrant pain: Secondary | ICD-10-CM | POA: Diagnosis not present

## 2018-03-08 ENCOUNTER — Other Ambulatory Visit: Payer: Self-pay

## 2018-03-08 DIAGNOSIS — Z79899 Other long term (current) drug therapy: Secondary | ICD-10-CM

## 2018-03-09 LAB — COMPLETE METABOLIC PANEL WITH GFR
AG RATIO: 1.8 (calc) (ref 1.0–2.5)
ALBUMIN MSPROF: 4.2 g/dL (ref 3.6–5.1)
ALT: 16 U/L (ref 6–29)
AST: 22 U/L (ref 10–35)
Alkaline phosphatase (APISO): 85 U/L (ref 33–130)
BUN: 18 mg/dL (ref 7–25)
CALCIUM: 10.8 mg/dL — AB (ref 8.6–10.4)
CO2: 29 mmol/L (ref 20–32)
CREATININE: 0.87 mg/dL (ref 0.60–0.93)
Chloride: 108 mmol/L (ref 98–110)
GFR, EST AFRICAN AMERICAN: 76 mL/min/{1.73_m2} (ref >=60)
GFR, EST NON AFRICAN AMERICAN: 66 mL/min/{1.73_m2} (ref >=60)
GLOBULIN: 2.4 g/dL (ref 1.9–3.7)
Glucose, Bld: 78 mg/dL (ref 65–99)
POTASSIUM: 4.4 mmol/L (ref 3.5–5.3)
Sodium: 143 mmol/L (ref 135–146)
Total Bilirubin: 0.4 mg/dL (ref 0.2–1.2)
Total Protein: 6.6 g/dL (ref 6.1–8.1)

## 2018-03-09 LAB — CBC WITH DIFFERENTIAL/PLATELET
Basophils Absolute: 29 cells/uL (ref 0–200)
Basophils Relative: 0.5 %
Eosinophils Absolute: 70 cells/uL (ref 15–500)
Eosinophils Relative: 1.2 %
HCT: 47 % — ABNORMAL HIGH (ref 35.0–45.0)
Hemoglobin: 15.9 g/dL — ABNORMAL HIGH (ref 11.7–15.5)
Lymphs Abs: 1543 cells/uL (ref 850–3900)
MCH: 32.9 pg (ref 27.0–33.0)
MCHC: 33.8 g/dL (ref 32.0–36.0)
MCV: 97.3 fL (ref 80.0–100.0)
MONOS PCT: 12.2 %
MPV: 11.6 fL (ref 7.5–12.5)
NEUTROS PCT: 59.5 %
Neutro Abs: 3451 cells/uL (ref 1500–7800)
PLATELETS: 185 10*3/uL (ref 140–400)
RBC: 4.83 10*6/uL (ref 3.80–5.10)
RDW: 12.8 % (ref 11.0–15.0)
TOTAL LYMPHOCYTE: 26.6 %
WBC mixed population: 708 cells/uL (ref 200–950)
WBC: 5.8 10*3/uL (ref 3.8–10.8)

## 2018-03-09 NOTE — Progress Notes (Signed)
Avoid Ca supplement

## 2018-03-11 DIAGNOSIS — N390 Urinary tract infection, site not specified: Secondary | ICD-10-CM | POA: Diagnosis not present

## 2018-03-11 DIAGNOSIS — Z6827 Body mass index (BMI) 27.0-27.9, adult: Secondary | ICD-10-CM | POA: Diagnosis not present

## 2018-03-12 ENCOUNTER — Other Ambulatory Visit: Payer: Self-pay | Admitting: Rheumatology

## 2018-03-12 NOTE — Telephone Encounter (Signed)
Last Visit: 11/09/17 Next Visit: 04/19/18  Labs: 03/08/18 elevated calcium  Okay to refill per Dr. Estanislado Pandy

## 2018-04-05 NOTE — Progress Notes (Signed)
Office Visit Note  Patient: Joy Fernandez             Date of Birth: 1946-01-31           MRN: [id number]             PCP: Ernestene Kiel, MD Referring: Ernestene Kiel, MD Visit Date: 04/19/2018 Occupation: @GUAROCC @  Subjective:  Medication monitoring    History of Present Illness: Joy Fernandez is a 73 y.o. female with history of seronegative rheumatoid arthritis, osteoarthritis, and osteoporosis.  Patient is on methotrexate 6 tablets by mouth once weekly and folic acid 2 mg by mouth daily.  She discontinued Boniva on her own after her DEXA on 01/18/18.  She is not taking calcium or vitamin D due to elevated calcium revealed on recent lab work. She has been working out at least twice a week.  She has been doing weight lifting and balance exercises.  She does not want to restart Boniva at this time.  She denies any recent rheumatoid arthritis flares.  She reports some pain in the right 5th digit but denies any joint swelling.  She denies any triggering or locking of this digit.  She denies any other joint pain or joint swelling.  She denies missing any doses recently.  She denies any recent infections.  She did not receive the influenza vaccine but is considering having the pneumonia vaccine recommended by her PCP.     Activities of Daily Living:  Patient reports morning stiffness for5 minutes.   Patient Denies nocturnal pain.  Difficulty dressing/grooming: Denies Difficulty climbing stairs: Denies Difficulty getting out of chair: Denies Difficulty using hands for taps, buttons, cutlery, and/or writing: Denies  Review of Systems  Constitutional: Negative for fatigue.  HENT: Negative for mouth sores, mouth dryness and nose dryness.   Eyes: Negative for pain, visual disturbance and dryness.  Respiratory: Negative for cough, hemoptysis, shortness of breath and difficulty breathing.   Cardiovascular: Negative for chest pain, palpitations, hypertension and swelling in legs/feet.    Gastrointestinal: Negative for blood in stool, constipation and diarrhea.  Endocrine: Negative for increased urination.  Genitourinary: Negative for painful urination.  Musculoskeletal: Positive for arthralgias, joint pain and morning stiffness. Negative for joint swelling, myalgias, muscle weakness, muscle tenderness and myalgias.  Skin: Negative for color change, pallor, rash, hair loss, nodules/bumps, skin tightness, ulcers and sensitivity to sunlight.  Allergic/Immunologic: Negative for susceptible to infections.  Neurological: Negative for dizziness, numbness, headaches and weakness.  Hematological: Negative for swollen glands.  Psychiatric/Behavioral: Negative for depressed mood and sleep disturbance. The patient is nervous/anxious.     PMFS History:  Patient Active Problem List   Diagnosis Date Noted  . History of depression 06/06/2016  . History of gastroesophageal reflux (GERD) 06/06/2016  . Rheumatoid arthritis of multiple sites with negative rheumatoid factor ([place]) 05/30/2016  . Primary osteoarthritis of both feet 05/30/2016  . Primary osteoarthritis of both hands 05/30/2016  . High risk medication use 05/30/2016  . Age-related osteoporosis without current pathological fracture 05/30/2016  . Idiopathic thrombocytopenic purpura ([place]) 02/24/2011    Past Medical History:  Diagnosis Date  . Arthritis   . Depression   . Insomnia   . ITP (idiopathic thrombocytopenic purpura) 02/24/2011  . Osteoporosis     Family History  Problem Relation Age of Onset  . Hypertension Mother   . Heart disease Father   . Hypertension Brother   . Depression Daughter   . Depression Son    Past Surgical History:  Procedure Laterality Date  . ABDOMINAL HYSTERECTOMY    . BREAST SURGERY Left    lumpectomy   . CHOLECYSTECTOMY    . MOHS SURGERY     x4   Social History   Social History Narrative  . Not on file    Objective: Vital Signs: BP 129/80 (BP Location: Left Arm, Patient  Position: Sitting, Cuff Size: Small)   Pulse 71   Resp 12   Ht 5' 2.5" (1.588 m)   Wt 155 lb 3.2 oz (70.4 kg)   BMI 27.93 kg/m    Physical Exam Vitals signs and nursing note reviewed.  Constitutional:      Appearance: She is well-developed.  HENT:     Head: Normocephalic and atraumatic.  Eyes:     Conjunctiva/sclera: Conjunctivae normal.  Neck:     Musculoskeletal: Normal range of motion.  Cardiovascular:     Rate and Rhythm: Normal rate and regular rhythm.     Heart sounds: Normal heart sounds.  Pulmonary:     Effort: Pulmonary effort is normal.     Breath sounds: Normal breath sounds.  Abdominal:     General: Bowel sounds are normal.     Palpations: Abdomen is soft.  Lymphadenopathy:     Cervical: No cervical adenopathy.  Skin:    General: Skin is warm and dry.     Capillary Refill: Capillary refill takes less than 2 seconds.  Neurological:     Mental Status: She is alert and oriented to person, place, and time.  Psychiatric:        Behavior: Behavior normal.      Musculoskeletal Exam: C-spine, thoracic spine, and lumbar spine good ROM.  No midline spinal tenderness.  No SI joint tenderness.  Shoulder joints, elbow joints, wrist joints, MCPs, PIPs, and DIPs good ROM with no synovitis.  PIP and DIP synovial thickening.  Bilateral CMC joint synovial thickening.  Hip joints, knee joints, ankle joints, MTPs, PIPs, and DIPs good ROM with no synovitis.  No warmth or effusion of knee joints.  No tenderness or swelling of ankle joints. No tenderness over trochanteric bursa bilaterally.    CDAI Exam: CDAI Score: 0.4  Patient Global Assessment: 2 (mm); Provider Global Assessment: 2 (mm) Swollen: 0 ; Tender: 0  Joint Exam   Not documented   There is currently no information documented on the homunculus. Go to the Rheumatology activity and complete the homunculus joint exam.  Investigation: No additional findings.  Imaging: No results found.  Recent Labs: Lab Results    Component Value Date   WBC 5.8 03/08/2018   HGB 15.9 (H) 03/08/2018   PLT 185 03/08/2018   NA 143 03/08/2018   K 4.4 03/08/2018   CL 108 03/08/2018   CO2 29 03/08/2018   GLUCOSE 78 03/08/2018   BUN 18 03/08/2018   CREATININE 0.87 03/08/2018   BILITOT 0.4 03/08/2018   ALKPHOS 73 07/24/2016   AST 22 03/08/2018   ALT 16 03/08/2018   PROT 6.6 03/08/2018   ALBUMIN 3.9 07/24/2016   CALCIUM 10.8 (H) 03/08/2018   GFRAA 76 03/08/2018    Speciality Comments: No specialty comments available.  Procedures:  No procedures performed Allergies: Other and Codeine     Assessment / Plan:     Visit Diagnoses: Rheumatoid arthritis of multiple sites with negative rheumatoid factor ([place]): She has no active synovitis on exam.  She has not had any recent rheumatoid arthritis flares.  She has no joint pain or joint swelling.  She  has minimal morning stiffness.  She is clinically doing well on MTX 6 tablets by mouth once weekly and folic acid 2 mg po daily.  She will continue on this current treatment regimen. She does not need any refills at this time. She was advised to notify us if she develops increased joint pain or joint swelling. She will follow up in 5 months.   High risk medication use - Methotrexate 6 tablets every every week, folic acid 2 mg by mouth daily.  Most recent CBC/CMP stable except for elevated calcium on 03/08/2018.  Next CBC/CMP due in March and will monitor every 3 months.  Standing orders are in place. She has not had any recent infections.  She did not receive the influenza vaccine. She is considering receiving the recommended pneumonia vaccine.  Primary osteoarthritis of both hands: She has PIP and DIP synovial thickening consistent with osteoarthritis.  She has bilateral CMC joint synovial thickening.  She has complete fist formation bilaterally.  Joint protection and muscle strengthening were discussed.   Primary osteoarthritis of both feet: She has no discomfort in her feet at  this time.    Age-related osteoporosis without current pathological fracture -She discontinued Boniva after her recent DEXA on 01/18/18. , DEXA 01/18/2018 BMD: 0.526, T-score: -2.8. DEXA 12/25/2015 T score -3.31 October 2013 T score was -3.5. Lab work obtained on 03/08/18 revealed elevated calcium-10.8.  She is not taking a calcium or vitamin D supplement at this time.  She has been working on several times per week and performing weight lifting and balance exercises.  We reorder DEXA in October 2021.  Other medical conditions are listed as follows:   Idiopathic thrombocytopenic purpura (H[facility])  History of gastroesophageal reflux (GERD)  History of depression   Orders: No orders of the defined types were placed in this encounter.  No orders of the defined types were placed in this encounter.    Follow-Up Instructions: Return in about 5 months (around 09/18/2018) for Rheumatoid arthritis, Osteoarthritis.   Ofilia Neas, PA-C   I examined and evaluated the patient with Hazel Sams PA.  Patient had no synovitis on my examination.  She continues to have some discomfort due to underlying osteoarthritis.  Her bone density has improved but is still is not in osteopenia range.  Patient has decided to come off Boniva.  We will repeat her bone density in 2 years.  The plan of care was discussed as noted above.  Bo Merino, MD  Note - This record has been created using Editor, commissioning.  Chart creation errors have been sought, but may not always  have been located. Such creation errors do not reflect on  the standard of medical care.

## 2018-04-19 ENCOUNTER — Encounter: Payer: Self-pay | Admitting: Rheumatology

## 2018-04-19 ENCOUNTER — Ambulatory Visit: Payer: PPO | Admitting: Rheumatology

## 2018-04-19 VITALS — BP 129/80 | HR 71 | Resp 12 | Ht 62.5 in | Wt 155.2 lb

## 2018-04-19 DIAGNOSIS — M19071 Primary osteoarthritis, right ankle and foot: Secondary | ICD-10-CM

## 2018-04-19 DIAGNOSIS — M810 Age-related osteoporosis without current pathological fracture: Secondary | ICD-10-CM

## 2018-04-19 DIAGNOSIS — M19042 Primary osteoarthritis, left hand: Secondary | ICD-10-CM | POA: Diagnosis not present

## 2018-04-19 DIAGNOSIS — Z8659 Personal history of other mental and behavioral disorders: Secondary | ICD-10-CM | POA: Diagnosis not present

## 2018-04-19 DIAGNOSIS — M19072 Primary osteoarthritis, left ankle and foot: Secondary | ICD-10-CM | POA: Diagnosis not present

## 2018-04-19 DIAGNOSIS — M0609 Rheumatoid arthritis without rheumatoid factor, multiple sites: Secondary | ICD-10-CM | POA: Diagnosis not present

## 2018-04-19 DIAGNOSIS — M19041 Primary osteoarthritis, right hand: Secondary | ICD-10-CM

## 2018-04-19 DIAGNOSIS — Z79899 Other long term (current) drug therapy: Secondary | ICD-10-CM

## 2018-04-19 DIAGNOSIS — Z8719 Personal history of other diseases of the digestive system: Secondary | ICD-10-CM

## 2018-04-19 DIAGNOSIS — D693 Immune thrombocytopenic purpura: Secondary | ICD-10-CM | POA: Diagnosis not present

## 2018-04-19 NOTE — Patient Instructions (Signed)
Standing Labs We placed an order today for your standing lab work.    Please come back and get your standing labs in March and every 3 months  We have open lab Monday through Friday from 8:30-11:30 AM and 1:30-4:00 PM  at the office of Dr. Shaili Deveshwar.   You may experience shorter wait times on Monday and Friday afternoons. The office is located at [address], [address], [place], [place] [address] No appointment is necessary.   Labs are drawn by [facility].  You may receive a bill from [facility] for your lab work.  If you wish to have your labs drawn at another location, please call the office 24 hours in advance to send orders.  If you have any questions regarding directions or hours of operation,  please call [phone].   Just as a reminder please drink plenty of water prior to coming for your lab work. Thanks!  

## 2018-06-15 DIAGNOSIS — Z23 Encounter for immunization: Secondary | ICD-10-CM | POA: Diagnosis not present

## 2018-06-15 DIAGNOSIS — D224 Melanocytic nevi of scalp and neck: Secondary | ICD-10-CM | POA: Diagnosis not present

## 2018-06-15 DIAGNOSIS — L821 Other seborrheic keratosis: Secondary | ICD-10-CM | POA: Diagnosis not present

## 2018-06-15 DIAGNOSIS — Z808 Family history of malignant neoplasm of other organs or systems: Secondary | ICD-10-CM | POA: Diagnosis not present

## 2018-06-15 DIAGNOSIS — D2261 Melanocytic nevi of right upper limb, including shoulder: Secondary | ICD-10-CM | POA: Diagnosis not present

## 2018-06-15 DIAGNOSIS — Z85828 Personal history of other malignant neoplasm of skin: Secondary | ICD-10-CM | POA: Diagnosis not present

## 2018-07-20 DIAGNOSIS — M25552 Pain in left hip: Secondary | ICD-10-CM | POA: Diagnosis not present

## 2018-07-20 DIAGNOSIS — M25551 Pain in right hip: Secondary | ICD-10-CM | POA: Diagnosis not present

## 2018-07-20 DIAGNOSIS — Z0000 Encounter for general adult medical examination without abnormal findings: Secondary | ICD-10-CM | POA: Diagnosis not present

## 2018-07-20 DIAGNOSIS — Z1331 Encounter for screening for depression: Secondary | ICD-10-CM | POA: Diagnosis not present

## 2018-07-20 DIAGNOSIS — R5383 Other fatigue: Secondary | ICD-10-CM | POA: Diagnosis not present

## 2018-07-20 DIAGNOSIS — Z23 Encounter for immunization: Secondary | ICD-10-CM | POA: Diagnosis not present

## 2018-07-20 DIAGNOSIS — Z1339 Encounter for screening examination for other mental health and behavioral disorders: Secondary | ICD-10-CM | POA: Diagnosis not present

## 2018-08-09 DIAGNOSIS — Z79899 Other long term (current) drug therapy: Secondary | ICD-10-CM | POA: Diagnosis not present

## 2018-08-09 DIAGNOSIS — F419 Anxiety disorder, unspecified: Secondary | ICD-10-CM | POA: Diagnosis not present

## 2018-08-09 DIAGNOSIS — K219 Gastro-esophageal reflux disease without esophagitis: Secondary | ICD-10-CM | POA: Diagnosis not present

## 2018-08-09 DIAGNOSIS — E781 Pure hyperglyceridemia: Secondary | ICD-10-CM | POA: Diagnosis not present

## 2018-08-09 DIAGNOSIS — R5383 Other fatigue: Secondary | ICD-10-CM | POA: Diagnosis not present

## 2018-08-09 DIAGNOSIS — G4700 Insomnia, unspecified: Secondary | ICD-10-CM | POA: Diagnosis not present

## 2018-08-09 DIAGNOSIS — M810 Age-related osteoporosis without current pathological fracture: Secondary | ICD-10-CM | POA: Diagnosis not present

## 2018-08-09 DIAGNOSIS — Z6827 Body mass index (BMI) 27.0-27.9, adult: Secondary | ICD-10-CM | POA: Diagnosis not present

## 2018-08-09 DIAGNOSIS — M059 Rheumatoid arthritis with rheumatoid factor, unspecified: Secondary | ICD-10-CM | POA: Diagnosis not present

## 2018-08-09 DIAGNOSIS — R079 Chest pain, unspecified: Secondary | ICD-10-CM | POA: Diagnosis not present

## 2018-08-09 DIAGNOSIS — R42 Dizziness and giddiness: Secondary | ICD-10-CM | POA: Diagnosis not present

## 2018-08-18 ENCOUNTER — Other Ambulatory Visit: Payer: Self-pay | Admitting: Rheumatology

## 2018-08-19 NOTE — Telephone Encounter (Signed)
Last Visit: 04/19/18 Next Visit: 09/20/18 Labs: 08/10/18 MVC 99 MCH 33.7 Chloride 107  Per Dr. Estanislado Pandy patient has been advised to make sure she is taking folic acid as prescribed. Patient states has been skipping from time to time. Patient advised it is important to take it daily as prescribed. Patient verbalized understanding.   Okay to refill per Dr. Estanislado Pandy

## 2018-08-26 ENCOUNTER — Telehealth: Payer: Self-pay | Admitting: *Deleted

## 2018-08-26 MED ORDER — FOLIC ACID 1 MG PO TABS
2.0000 mg | ORAL_TABLET | Freq: Every day | ORAL | 3 refills | Status: DC
Start: 2018-08-26 — End: 2020-04-24

## 2018-08-26 NOTE — Telephone Encounter (Signed)
Refill Request received via fax  Last Visit: 04/19/18 Next Visit: 09/20/18  Okay to refill per Dr.Deveshwar

## 2018-09-06 NOTE — Progress Notes (Signed)
Office Visit Note  Patient: Joy Fernandez             Date of Birth: 02-Jul-1945           MRN: [id number]             PCP: Ernestene Kiel, MD Referring: Ernestene Kiel, MD Visit Date: 09/20/2018 Occupation: @GUAROCC @  Subjective:  Rheumatoid Arthritis (Doing good. left knee pain  - fell x 2 weeks, difficulty bearning weight)    History of Present Illness: Joy Fernandez is a 73 y.o. female rheumatoid arthritis and osteoarthritis.  She states she has been doing well on methotrexate 7 tablets/week.  She denies any joint swelling.  She states about 2 weeks ago she fell in her carport.  It happened because of twisting of her left foot.  She states she had some bruising on the right shin.  She also had some abrasion on her left elbow.  She was seen at the urgent care where they did x-ray of her left knee joint which was unremarkable.  She states she still have some swelling in her left knee joint which is gradually going down.  Activities of Daily Living:  Patient reports morning stiffness for 5 minutes.   Patient Denies nocturnal pain.  Difficulty dressing/grooming: Denies Difficulty climbing stairs: Reports Difficulty getting out of chair: Denies Difficulty using hands for taps, buttons, cutlery, and/or writing: Denies  Review of Systems  Constitutional: Negative for fatigue, night sweats, weight gain and weight loss.  HENT: Negative for mouth sores, trouble swallowing, trouble swallowing, mouth dryness and nose dryness.   Eyes: Negative for pain, redness, visual disturbance and dryness.  Respiratory: Negative for cough, shortness of breath and difficulty breathing.   Cardiovascular: Negative for chest pain, palpitations, hypertension, irregular heartbeat and swelling in legs/feet.  Gastrointestinal: Negative for blood in stool, constipation and diarrhea.  Endocrine: Positive for cold intolerance. Negative for increased urination.  Genitourinary: Negative for painful urination and  vaginal dryness.  Musculoskeletal: Positive for morning stiffness. Negative for arthralgias, joint pain, joint swelling, myalgias, muscle weakness, muscle tenderness and myalgias.  Skin: Negative for color change, rash, hair loss, skin tightness, ulcers and sensitivity to sunlight.  Allergic/Immunologic: Negative for susceptible to infections.  Neurological: Negative for dizziness, memory loss, night sweats and weakness.  Hematological: Negative for bruising/bleeding tendency and swollen glands.  Psychiatric/Behavioral: Negative for depressed mood and sleep disturbance. The patient is not nervous/anxious.     PMFS History:  Patient Active Problem List   Diagnosis Date Noted  . History of depression 06/06/2016  . History of gastroesophageal reflux (GERD) 06/06/2016  . Rheumatoid arthritis of multiple sites with negative rheumatoid factor ([place]) 05/30/2016  . Primary osteoarthritis of both feet 05/30/2016  . Primary osteoarthritis of both hands 05/30/2016  . High risk medication use 05/30/2016  . Age-related osteoporosis without current pathological fracture 05/30/2016  . Idiopathic thrombocytopenic purpura ([place]) 02/24/2011    Past Medical History:  Diagnosis Date  . Arthritis   . Depression   . Insomnia   . ITP (idiopathic thrombocytopenic purpura) 02/24/2011  . Osteoporosis   . Rheumatoid arthritis ([place])     Family History  Problem Relation Age of Onset  . Hypertension Mother   . Heart disease Father   . Hypertension Brother   . Depression Daughter   . Depression Son    Past Surgical History:  Procedure Laterality Date  . ABDOMINAL HYSTERECTOMY    . BREAST SURGERY Left    lumpectomy   .  CHOLECYSTECTOMY    . MOHS SURGERY     x4   Social History   Social History Narrative  . Not on file    There is no immunization history on file for this patient.   Objective: Vital Signs: BP (!) 111/59 (BP Location: Left Arm, Patient Position: Sitting, Cuff Size: Normal)   Pulse  73   Resp 16   Ht 5\' 3"  (1.6 m)   Wt 155 lb (70.3 kg)   BMI 27.46 kg/m    Physical Exam Vitals signs and nursing note reviewed.  Constitutional:      Appearance: She is well-developed.  HENT:     Head: Normocephalic and atraumatic.  Eyes:     Conjunctiva/sclera: Conjunctivae normal.  Neck:     Musculoskeletal: Normal range of motion.  Cardiovascular:     Rate and Rhythm: Normal rate and regular rhythm.     Heart sounds: Normal heart sounds.  Pulmonary:     Effort: Pulmonary effort is normal.     Breath sounds: Normal breath sounds.  Abdominal:     General: Bowel sounds are normal.     Palpations: Abdomen is soft.  Lymphadenopathy:     Cervical: No cervical adenopathy.  Skin:    General: Skin is warm and dry.     Capillary Refill: Capillary refill takes less than 2 seconds.  Neurological:     Mental Status: She is alert and oriented to person, place, and time.  Psychiatric:        Behavior: Behavior normal.      Musculoskeletal Exam: C-spine thoracic and lumbar spine good range of motion.  Shoulder joints elbow joints wrist joint MCPs PIPs DIPs with good range of motion.  She has bilateral DIP and CMC thickening but no synovitis was noted.  She has some warmth on palpation of her left knee joint due to recent fall.  Ankle joints MTPs PIPs with good range of motion with no synovitis.  She has osteoarthritic changes in her feet with PIP and DIP thickening.  CDAI Exam: CDAI Score: 0.2  Patient Global: 1 mm; Provider Global: 1 mm Swollen: 0 ; Tender: 0  Joint Exam   No joint exam has been documented for this visit   There is currently no information documented on the homunculus. Go to the Rheumatology activity and complete the homunculus joint exam.  Investigation: No additional findings.  Imaging: No results found.  Recent Labs: Lab Results  Component Value Date   WBC 5.8 03/08/2018   HGB 15.9 (H) 03/08/2018   PLT 185 03/08/2018   NA 143 03/08/2018   K 4.4  03/08/2018   CL 108 03/08/2018   CO2 29 03/08/2018   GLUCOSE 78 03/08/2018   BUN 18 03/08/2018   CREATININE 0.87 03/08/2018   BILITOT 0.4 03/08/2018   ALKPHOS 73 07/24/2016   AST 22 03/08/2018   ALT 16 03/08/2018   PROT 6.6 03/08/2018   ALBUMIN 3.9 07/24/2016   CALCIUM 10.8 (H) 03/08/2018   GFRAA 76 03/08/2018    Speciality Comments: No specialty comments available.  Procedures:  No procedures performed Allergies: Other and Codeine   Assessment / Plan:     Visit Diagnoses: Rheumatoid arthritis of multiple sites with negative rheumatoid factor ([place]) -patient is clinically doing well.  She denies any joint pain or joint swelling.  She had no synovitis on examination.  She will continue methotrexate.    High risk medication use - Methotrexate 2.5 mg 6 tablets every 7 days and  folic acid 1 mg 2 tablets daily.  Most recent CBC/CMP stable on 03/08/2018.  Due for CBC/CMP today and will monitor every 3 months.  Standing orders are in place. Recommend annual influenza, Pneumovax 23, Prevnar 13, and Shingrix as indicated.. - Plan: CBC with Differential/Platelet, COMPLETE METABOLIC PANEL WITH GFR, today and every 3 months to monitor for drug toxicity.  Primary osteoarthritis of both hands - Plan: Joint protection muscle strengthening was discussed.  Acute pain of left knee -she has been having some discomfort in her left knee joint due to recent fall.  She has been icing and using some topical anti-inflammatories which is been helpful.  Primary osteoarthritis of both feet -due to underlying osteoarthritis.  Proper fitting shoes were discussed.  History of recent fall - Plan: Exercise and fall prevention was discussed.  Age-related osteoporosis without current pathological fracture - discontinued Boniva after her recent DEXA on 01/18/18. , DEXA 01/18/2018 BMD: 0.526, T-score: -2.8. DEXA 12/25/2015 T score -3.31 October 2013 T score was -3.5 -patient does not want to take any medications.  She  wants to try resistive exercises calcium and vitamin D.  We will repeat bone density in 2 years.  History of gastroesophageal reflux (GERD)  Idiopathic thrombocytopenic purpura ([place])  History of depression  Orders: Orders Placed This Encounter  Procedures  . CBC with Differential/Platelet  . COMPLETE METABOLIC PANEL WITH GFR   No orders of the defined types were placed in this encounter.     Follow-Up Instructions: Return for Rheumatoid arthritis, Osteoarthritis.   Bo Merino, MD  Note - This record has been created using Editor, commissioning.  Chart creation errors have been sought, but may not always  have been located. Such creation errors do not reflect on  the standard of medical care.

## 2018-09-09 DIAGNOSIS — S50312A Abrasion of left elbow, initial encounter: Secondary | ICD-10-CM | POA: Diagnosis not present

## 2018-09-09 DIAGNOSIS — M25562 Pain in left knee: Secondary | ICD-10-CM | POA: Diagnosis not present

## 2018-09-09 DIAGNOSIS — S8011XA Contusion of right lower leg, initial encounter: Secondary | ICD-10-CM | POA: Diagnosis not present

## 2018-09-09 DIAGNOSIS — W109XXA Fall (on) (from) unspecified stairs and steps, initial encounter: Secondary | ICD-10-CM | POA: Diagnosis not present

## 2018-09-13 DIAGNOSIS — Z1231 Encounter for screening mammogram for malignant neoplasm of breast: Secondary | ICD-10-CM | POA: Diagnosis not present

## 2018-09-20 ENCOUNTER — Encounter: Payer: Self-pay | Admitting: Rheumatology

## 2018-09-20 ENCOUNTER — Other Ambulatory Visit: Payer: Self-pay

## 2018-09-20 ENCOUNTER — Ambulatory Visit: Payer: PPO | Admitting: Rheumatology

## 2018-09-20 VITALS — BP 111/59 | HR 73 | Resp 16 | Ht 63.0 in | Wt 155.0 lb

## 2018-09-20 DIAGNOSIS — M19071 Primary osteoarthritis, right ankle and foot: Secondary | ICD-10-CM | POA: Diagnosis not present

## 2018-09-20 DIAGNOSIS — Z79899 Other long term (current) drug therapy: Secondary | ICD-10-CM | POA: Diagnosis not present

## 2018-09-20 DIAGNOSIS — M25562 Pain in left knee: Secondary | ICD-10-CM

## 2018-09-20 DIAGNOSIS — M19072 Primary osteoarthritis, left ankle and foot: Secondary | ICD-10-CM | POA: Diagnosis not present

## 2018-09-20 DIAGNOSIS — Z8659 Personal history of other mental and behavioral disorders: Secondary | ICD-10-CM | POA: Diagnosis not present

## 2018-09-20 DIAGNOSIS — D693 Immune thrombocytopenic purpura: Secondary | ICD-10-CM

## 2018-09-20 DIAGNOSIS — M0609 Rheumatoid arthritis without rheumatoid factor, multiple sites: Secondary | ICD-10-CM | POA: Diagnosis not present

## 2018-09-20 DIAGNOSIS — Z8719 Personal history of other diseases of the digestive system: Secondary | ICD-10-CM

## 2018-09-20 DIAGNOSIS — M19041 Primary osteoarthritis, right hand: Secondary | ICD-10-CM

## 2018-09-20 DIAGNOSIS — M810 Age-related osteoporosis without current pathological fracture: Secondary | ICD-10-CM

## 2018-09-20 DIAGNOSIS — Z9181 History of falling: Secondary | ICD-10-CM | POA: Diagnosis not present

## 2018-09-20 DIAGNOSIS — M19042 Primary osteoarthritis, left hand: Secondary | ICD-10-CM

## 2018-09-20 NOTE — Patient Instructions (Signed)
Standing Labs We placed an order today for your standing lab work.    Please come back and get your standing labs in September and every 3 months  We have open lab daily Monday through Thursday from 8:30-12:30 PM and 1:30-4:30 PM and Friday from 8:30-12:30 PM and 1:30 -4:00 PM at the office of Dr. Samari Bittinger.   You may experience shorter wait times on Monday and Friday afternoons. The office is located at [address], [address], [place], [place] [address] No appointment is necessary.   Labs are drawn by [facility].  You may receive a bill from [facility] for your lab work.  If you wish to have your labs drawn at another location, please call the office 24 hours in advance to send orders.  If you have any questions regarding directions or hours of operation,  please call [phone].   Just as a reminder please drink plenty of water prior to coming for your lab work. Thanks!  

## 2018-09-21 LAB — CBC WITH DIFFERENTIAL/PLATELET
Absolute Monocytes: 519 cells/uL (ref 200–950)
Basophils Absolute: 18 cells/uL (ref 0–200)
Basophils Relative: 0.3 %
Eosinophils Absolute: 83 cells/uL (ref 15–500)
Eosinophils Relative: 1.4 %
HCT: 45.4 % — ABNORMAL HIGH (ref 35.0–45.0)
Hemoglobin: 15.4 g/dL (ref 11.7–15.5)
Lymphs Abs: 1558 cells/uL (ref 850–3900)
MCH: 32.7 pg (ref 27.0–33.0)
MCHC: 33.9 g/dL (ref 32.0–36.0)
MCV: 96.4 fL (ref 80.0–100.0)
MPV: 11.5 fL (ref 7.5–12.5)
Monocytes Relative: 8.8 %
Neutro Abs: 3723 cells/uL (ref 1500–7800)
Neutrophils Relative %: 63.1 %
Platelets: 174 10*3/uL (ref 140–400)
RBC: 4.71 10*6/uL (ref 3.80–5.10)
RDW: 12.9 % (ref 11.0–15.0)
Total Lymphocyte: 26.4 %
WBC: 5.9 10*3/uL (ref 3.8–10.8)

## 2018-09-21 LAB — COMPLETE METABOLIC PANEL WITH GFR
AG Ratio: 1.7 (calc) (ref 1.0–2.5)
ALT: 14 U/L (ref 6–29)
AST: 20 U/L (ref 10–35)
Albumin: 3.9 g/dL (ref 3.6–5.1)
Alkaline phosphatase (APISO): 88 U/L (ref 37–153)
BUN: 20 mg/dL (ref 7–25)
CO2: 27 mmol/L (ref 20–32)
Calcium: 10.1 mg/dL (ref 8.6–10.4)
Chloride: 108 mmol/L (ref 98–110)
Creat: 0.92 mg/dL (ref 0.60–0.93)
GFR, Est African American: 71 mL/min/{1.73_m2} (ref >=60)
GFR, Est Non African American: 61 mL/min/{1.73_m2} (ref >=60)
Globulin: 2.3 g/dL (calc) (ref 1.9–3.7)
Glucose, Bld: 80 mg/dL (ref 65–99)
Potassium: 4.2 mmol/L (ref 3.5–5.3)
Sodium: 142 mmol/L (ref 135–146)
Total Bilirubin: 0.3 mg/dL (ref 0.2–1.2)
Total Protein: 6.2 g/dL (ref 6.1–8.1)

## 2018-09-21 NOTE — Progress Notes (Signed)
Labs are normal.

## 2018-12-23 DIAGNOSIS — D225 Melanocytic nevi of trunk: Secondary | ICD-10-CM | POA: Diagnosis not present

## 2018-12-23 DIAGNOSIS — Z808 Family history of malignant neoplasm of other organs or systems: Secondary | ICD-10-CM | POA: Diagnosis not present

## 2018-12-23 DIAGNOSIS — D2261 Melanocytic nevi of right upper limb, including shoulder: Secondary | ICD-10-CM | POA: Diagnosis not present

## 2018-12-23 DIAGNOSIS — L821 Other seborrheic keratosis: Secondary | ICD-10-CM | POA: Diagnosis not present

## 2018-12-23 DIAGNOSIS — D485 Neoplasm of uncertain behavior of skin: Secondary | ICD-10-CM | POA: Diagnosis not present

## 2018-12-23 DIAGNOSIS — L720 Epidermal cyst: Secondary | ICD-10-CM | POA: Diagnosis not present

## 2018-12-23 DIAGNOSIS — Z85828 Personal history of other malignant neoplasm of skin: Secondary | ICD-10-CM | POA: Diagnosis not present

## 2018-12-23 DIAGNOSIS — C4441 Basal cell carcinoma of skin of scalp and neck: Secondary | ICD-10-CM | POA: Diagnosis not present

## 2018-12-23 DIAGNOSIS — Z23 Encounter for immunization: Secondary | ICD-10-CM | POA: Diagnosis not present

## 2018-12-23 DIAGNOSIS — D224 Melanocytic nevi of scalp and neck: Secondary | ICD-10-CM | POA: Diagnosis not present

## 2018-12-23 DIAGNOSIS — L668 Other cicatricial alopecia: Secondary | ICD-10-CM | POA: Diagnosis not present

## 2018-12-23 DIAGNOSIS — L905 Scar conditions and fibrosis of skin: Secondary | ICD-10-CM | POA: Diagnosis not present

## 2018-12-23 DIAGNOSIS — L723 Sebaceous cyst: Secondary | ICD-10-CM | POA: Diagnosis not present

## 2018-12-28 ENCOUNTER — Other Ambulatory Visit: Payer: Self-pay | Admitting: Rheumatology

## 2018-12-28 NOTE — Telephone Encounter (Signed)
Last Visit: 09/20/18 Next Visit: 02/14/19 Labs: 09/20/18 WNL  Patient reminded she id due to update labs.   Okay to refill 30 day supply per Dr. Estanislado Pandy

## 2019-01-03 DIAGNOSIS — H2513 Age-related nuclear cataract, bilateral: Secondary | ICD-10-CM | POA: Diagnosis not present

## 2019-01-12 ENCOUNTER — Other Ambulatory Visit: Payer: Self-pay | Admitting: *Deleted

## 2019-01-12 DIAGNOSIS — Z4802 Encounter for removal of sutures: Secondary | ICD-10-CM | POA: Diagnosis not present

## 2019-01-12 DIAGNOSIS — Z79899 Other long term (current) drug therapy: Secondary | ICD-10-CM | POA: Diagnosis not present

## 2019-01-12 DIAGNOSIS — C4441 Basal cell carcinoma of skin of scalp and neck: Secondary | ICD-10-CM | POA: Diagnosis not present

## 2019-01-12 DIAGNOSIS — L661 Lichen planopilaris: Secondary | ICD-10-CM | POA: Diagnosis not present

## 2019-01-12 LAB — COMPLETE METABOLIC PANEL WITH GFR
AG Ratio: 1.7 (calc) (ref 1.0–2.5)
ALT: 21 U/L (ref 6–29)
AST: 23 U/L (ref 10–35)
Albumin: 4 g/dL (ref 3.6–5.1)
Alkaline phosphatase (APISO): 79 U/L (ref 37–153)
BUN: 18 mg/dL (ref 7–25)
CO2: 26 mmol/L (ref 20–32)
Calcium: 10.2 mg/dL (ref 8.6–10.4)
Chloride: 105 mmol/L (ref 98–110)
Creat: 0.8 mg/dL (ref 0.60–0.93)
GFR, Est African American: 84 mL/min/{1.73_m2} (ref >=60)
GFR, Est Non African American: 72 mL/min/{1.73_m2} (ref >=60)
Globulin: 2.3 g/dL (calc) (ref 1.9–3.7)
Glucose, Bld: 149 mg/dL — ABNORMAL HIGH (ref 65–99)
Potassium: 4.1 mmol/L (ref 3.5–5.3)
Sodium: 141 mmol/L (ref 135–146)
Total Bilirubin: 0.5 mg/dL (ref 0.2–1.2)
Total Protein: 6.3 g/dL (ref 6.1–8.1)

## 2019-01-12 LAB — CBC WITH DIFFERENTIAL/PLATELET
Absolute Monocytes: 359 cells/uL (ref 200–950)
Basophils Absolute: 21 cells/uL (ref 0–200)
Basophils Relative: 0.3 %
Eosinophils Absolute: 90 cells/uL (ref 15–500)
Eosinophils Relative: 1.3 %
HCT: 44.9 % (ref 35.0–45.0)
Hemoglobin: 15.5 g/dL (ref 11.7–15.5)
Lymphs Abs: 1780 cells/uL (ref 850–3900)
MCH: 34.1 pg — ABNORMAL HIGH (ref 27.0–33.0)
MCHC: 34.5 g/dL (ref 32.0–36.0)
MCV: 98.7 fL (ref 80.0–100.0)
MPV: 11.5 fL (ref 7.5–12.5)
Monocytes Relative: 5.2 %
Neutro Abs: 4651 cells/uL (ref 1500–7800)
Neutrophils Relative %: 67.4 %
Platelets: 177 10*3/uL (ref 140–400)
RBC: 4.55 10*6/uL (ref 3.80–5.10)
RDW: 13.2 % (ref 11.0–15.0)
Total Lymphocyte: 25.8 %
WBC: 6.9 10*3/uL (ref 3.8–10.8)

## 2019-01-13 DIAGNOSIS — R1310 Dysphagia, unspecified: Secondary | ICD-10-CM | POA: Diagnosis not present

## 2019-01-13 DIAGNOSIS — K219 Gastro-esophageal reflux disease without esophagitis: Secondary | ICD-10-CM | POA: Diagnosis not present

## 2019-01-13 NOTE — Progress Notes (Signed)
Glucose is elevated.  All other labs are stable.

## 2019-02-08 NOTE — Progress Notes (Signed)
Office Visit Note  Patient: Joy Fernandez             Date of Birth: 07-Jan-1946           MRN: [id number]             PCP: Ernestene Kiel, MD Referring: Ernestene Kiel, MD Visit Date: 02/15/2019 Occupation: @GUAROCC @  Subjective:  Right elbow medial epicondylitis   History of Present Illness: Joy Fernandez is a 73 y.o. female with history of seronegative rheumatoid arthritis, osteoporosis, and osteoarthritis.  She is taking methotrexate 6 tablets by mouth once weekly and folic acid 2 mg a mouth daily.  She has not missed any doses of methotrexate recently.  She denies any recent rheumatoid arthritis flares.  She states that her only concern is right elbow discomfort.  She denies any injuries.  She states that the pain is intermittent and on the medial aspect.  She denies any other joint pain or joint swelling at this time.   Activities of Daily Living:  Patient reports morning stiffness for 0 minutes.   Patient Denies nocturnal pain.  Difficulty dressing/grooming: Denies Difficulty climbing stairs: Denies Difficulty getting out of chair: Denies Difficulty using hands for taps, buttons, cutlery, and/or writing: Denies  Review of Systems  Constitutional: Negative for fatigue.  HENT: Negative for mouth sores, mouth dryness and nose dryness.   Eyes: Negative for pain, itching, visual disturbance and dryness.  Respiratory: Negative for cough, hemoptysis, shortness of breath, wheezing and difficulty breathing.   Cardiovascular: Negative for chest pain, palpitations, hypertension and swelling in legs/feet.  Gastrointestinal: Negative for blood in stool, constipation and diarrhea.  Endocrine: Negative for increased urination.  Genitourinary: Negative for difficulty urinating and painful urination.  Musculoskeletal: Positive for arthralgias and joint pain. Negative for joint swelling, myalgias, muscle weakness, morning stiffness, muscle tenderness and myalgias.  Skin: Negative for  color change, pallor, rash, hair loss, nodules/bumps, skin tightness, ulcers and sensitivity to sunlight.  Allergic/Immunologic: Negative for susceptible to infections.  Neurological: Negative for dizziness, light-headedness, numbness, headaches, memory loss and weakness.  Hematological: Negative for bruising/bleeding tendency and swollen glands.  Psychiatric/Behavioral: Negative for depressed mood, confusion and sleep disturbance. The patient is not nervous/anxious.     PMFS History:  Patient Active Problem List   Diagnosis Date Noted  . History of depression 06/06/2016  . History of gastroesophageal reflux (GERD) 06/06/2016  . Rheumatoid arthritis of multiple sites with negative rheumatoid factor ([place]) 05/30/2016  . Primary osteoarthritis of both feet 05/30/2016  . Primary osteoarthritis of both hands 05/30/2016  . High risk medication use 05/30/2016  . Age-related osteoporosis without current pathological fracture 05/30/2016  . Idiopathic thrombocytopenic purpura ([place]) 02/24/2011    Past Medical History:  Diagnosis Date  . Arthritis   . Depression   . Insomnia   . ITP (idiopathic thrombocytopenic purpura) 02/24/2011  . Osteoporosis   . Rheumatoid arthritis ([place])     Family History  Problem Relation Age of Onset  . Hypertension Mother   . Heart disease Father   . Hypertension Brother   . Depression Daughter   . Depression Son    Past Surgical History:  Procedure Laterality Date  . ABDOMINAL HYSTERECTOMY    . BREAST SURGERY Left    lumpectomy   . CHOLECYSTECTOMY    . MOHS SURGERY     x4   Social History   Social History Narrative  . Not on file    There is no immunization history on file  for this patient.   Objective: Vital Signs: BP (!) 142/80 (BP Location: Left Arm, Patient Position: Sitting, Cuff Size: Normal)   Pulse 63   Resp 11   Ht 5\' 4"  (1.626 m)   Wt 157 lb 9.6 oz (71.5 kg)   BMI 27.05 kg/m    Physical Exam Vitals signs and nursing note  reviewed.  Constitutional:      Appearance: She is well-developed.  HENT:     Head: Normocephalic and atraumatic.  Eyes:     Conjunctiva/sclera: Conjunctivae normal.  Neck:     Musculoskeletal: Normal range of motion.  Cardiovascular:     Rate and Rhythm: Normal rate and regular rhythm.     Heart sounds: Normal heart sounds.  Pulmonary:     Effort: Pulmonary effort is normal.     Breath sounds: Normal breath sounds.  Abdominal:     General: Bowel sounds are normal.     Palpations: Abdomen is soft.  Lymphadenopathy:     Cervical: No cervical adenopathy.  Skin:    General: Skin is warm and dry.     Capillary Refill: Capillary refill takes less than 2 seconds.  Neurological:     Mental Status: She is alert and oriented to person, place, and time.  Psychiatric:        Behavior: Behavior normal.      Musculoskeletal Exam: C-spine, thoracic spine, lumbar spine good range of motion.  No midline spinal tenderness.  No SI joint tenderness.  Shoulder joints, elbow joints, wrist joints, MCPs, PIPs, DIPs good range of motion with no synovitis.  Tenderness over the right medial epicondyle.  She has complete fist formation bilaterally.  She has mild PIP and DIP synovial thickening consistent with osteoarthritis of both hands.  Hip joints, knee joints, ankle joints, MTPs, PIPs, DIPs good range of motion no synovitis.  No warmth or effusion of bilateral knee joints.  No tenderness or swelling of ankle joints.  CDAI Exam: CDAI Score: - Patient Global: -; Provider Global: - Swollen: -; Tender: - Joint Exam   No joint exam has been documented for this visit   There is currently no information documented on the homunculus. Go to the Rheumatology activity and complete the homunculus joint exam.  Investigation: No additional findings.  Imaging: No results found.  Recent Labs: Lab Results  Component Value Date   WBC 6.9 01/12/2019   HGB 15.5 01/12/2019   PLT 177 01/12/2019   NA 141  01/12/2019   K 4.1 01/12/2019   CL 105 01/12/2019   CO2 26 01/12/2019   GLUCOSE 149 (H) 01/12/2019   BUN 18 01/12/2019   CREATININE 0.80 01/12/2019   BILITOT 0.5 01/12/2019   ALKPHOS 73 07/24/2016   AST 23 01/12/2019   ALT 21 01/12/2019   PROT 6.3 01/12/2019   ALBUMIN 3.9 07/24/2016   CALCIUM 10.2 01/12/2019   GFRAA 84 01/12/2019    Speciality Comments: No specialty comments available.  Procedures:  No procedures performed Allergies: Other and Codeine   Assessment / Plan:     Visit Diagnoses: Rheumatoid arthritis of multiple sites with negative rheumatoid factor ([place]): She has no synovitis on exam.  She has not had any recent rheumatoid arthritis flares.  She is clinically doing well on methotrexate 6 tablets by mouth once weekly and folic acid 2 mg by mouth daily.  She has not missed any doses recently.  She has no joint pain or joint swelling at this time.  She has no morning stiffness.  She will continue taking methotrexate and folic acid as prescribed.  A refill methotrexate was sent to the pharmacy yesterday.  She is up-to-date on lab work.  She was advised to notify us if she develops increased joint pain or joint swelling.  She will follow-up in the office in 5 months.  High risk medication use -  Methotrexate 2.5 mg 6 tablets every 7 days and folic acid 1 mg 2 tablets daily.  CBC and CMP were drawn on 01/12/2019.  She will be due to update lab work in January and every 3 months.  Primary osteoarthritis of both hands: She has PIP and DIP synovial thickening consistent with osteoarthritis of both hands.  No tenderness or synovitis noted.  She has complete fist formation bilaterally.  Joint protection and muscle strengthening were discussed.  Medial epicondylitis of right elbow: She has tenderness over the medial aspect of the right elbow.  We discussed the diagnosis of medial epicondylitis.  We discussed using Voltaren gel topically as needed for pain relief.  She is also given a  handout of exercises to perform.  She was advised to notify us if her symptoms persist or worsen.  Primary osteoarthritis of both feet: She has good range of motion of bilateral ankle joints.  No tenderness or inflammation noted.  She has no discomfort in her feet at this time.  History of recent fall: She has not had any recent falls.  Age-related osteoporosis without current pathological fracture - discontinued Boniva after her recent DEXA on 01/18/18. , DEXA 01/18/2018 BMD: 0.526, T-score: -2.8. DEXA 12/25/2015 T score -3.31 October 2013 T score was -3.5  Other medical conditions are listed as follows:  History of gastroesophageal reflux (GERD)  Idiopathic thrombocytopenic purpura ([place])  History of depression  Orders: No orders of the defined types were placed in this encounter.  No orders of the defined types were placed in this encounter.    Follow-Up Instructions: Return in about 5 months (around 07/16/2019) for Rheumatoid arthritis.   Ofilia Neas, PA-C  Note - This record has been created using Dragon software.  Chart creation errors have been sought, but may not always  have been located. Such creation errors do not reflect on  the standard of medical care.

## 2019-02-14 ENCOUNTER — Other Ambulatory Visit: Payer: Self-pay | Admitting: Rheumatology

## 2019-02-14 ENCOUNTER — Ambulatory Visit: Payer: PPO | Admitting: Rheumatology

## 2019-02-14 NOTE — Telephone Encounter (Signed)
Last Visit: 09/20/18 Next Visit: 02/15/19 Labs: 01/12/19 Glucose is elevated. All other labs are stable.  Okay to refill per Dr. Estanislado Pandy

## 2019-02-15 ENCOUNTER — Ambulatory Visit: Payer: PPO | Admitting: Physician Assistant

## 2019-02-15 ENCOUNTER — Encounter: Payer: Self-pay | Admitting: Physician Assistant

## 2019-02-15 ENCOUNTER — Other Ambulatory Visit: Payer: Self-pay

## 2019-02-15 VITALS — BP 142/80 | HR 63 | Resp 11 | Ht 64.0 in | Wt 157.6 lb

## 2019-02-15 DIAGNOSIS — M19072 Primary osteoarthritis, left ankle and foot: Secondary | ICD-10-CM

## 2019-02-15 DIAGNOSIS — Z8719 Personal history of other diseases of the digestive system: Secondary | ICD-10-CM

## 2019-02-15 DIAGNOSIS — M19041 Primary osteoarthritis, right hand: Secondary | ICD-10-CM

## 2019-02-15 DIAGNOSIS — Z9181 History of falling: Secondary | ICD-10-CM | POA: Diagnosis not present

## 2019-02-15 DIAGNOSIS — Z79899 Other long term (current) drug therapy: Secondary | ICD-10-CM

## 2019-02-15 DIAGNOSIS — Z8659 Personal history of other mental and behavioral disorders: Secondary | ICD-10-CM | POA: Diagnosis not present

## 2019-02-15 DIAGNOSIS — M7701 Medial epicondylitis, right elbow: Secondary | ICD-10-CM | POA: Diagnosis not present

## 2019-02-15 DIAGNOSIS — D693 Immune thrombocytopenic purpura: Secondary | ICD-10-CM

## 2019-02-15 DIAGNOSIS — M19042 Primary osteoarthritis, left hand: Secondary | ICD-10-CM

## 2019-02-15 DIAGNOSIS — M19071 Primary osteoarthritis, right ankle and foot: Secondary | ICD-10-CM

## 2019-02-15 DIAGNOSIS — M0609 Rheumatoid arthritis without rheumatoid factor, multiple sites: Secondary | ICD-10-CM

## 2019-02-15 DIAGNOSIS — M810 Age-related osteoporosis without current pathological fracture: Secondary | ICD-10-CM | POA: Diagnosis not present

## 2019-02-15 NOTE — Patient Instructions (Addendum)
Standing Labs We placed an order today for your standing lab work.    Please come back and get your standing labs in January every 3 months   We have open lab daily Monday through Thursday from 8:30-12:30 PM and 1:30-4:30 PM and Friday from 8:30-12:30 PM and 1:30-4:00 PM at the office of Dr. Bo Merino.   You may experience shorter wait times on Monday and Friday afternoons. The office is located at [address] [place], [place], [place] [address] No appointment is necessary.   Labs are drawn by [facility].  You may receive a bill from [facility] for your lab work.  If you wish to have your labs drawn at another location, please call the office 24 hours in advance to send orders.  If you have any questions regarding directions or hours of operation,  please call [phone].   Just as a reminder please drink plenty of water prior to coming for your lab work. Thanks!   [facility] Ask your health care provider which exercises are safe for you. Do exercises exactly as told by your health care provider and adjust them as directed. It is normal to feel mild stretching, pulling, tightness, or discomfort as you do these exercises. Stop right away if you feel sudden pain or your pain gets worse. Do not begin these exercises until told by your health care provider. Stretching and range-of-motion exercises These exercises warm up your muscles and joints and improve the movement and flexibility of your elbow. Wrist extension  1. Straighten your left / right elbow in front of you with your palm facing up toward the ceiling. ? If told by your health care provider, bend your left / right elbow to a 90-degree angle (right angle) at your side. 2. With your other hand, gently pull your left / right hand and fingers toward the floor (extension). Stop when you feel a gentle stretch on the palm side of your forearm. 3. Hold this position for __________ seconds. Repeat __________ times.  Complete this exercise __________ times a day. Wrist flexion  1. Straighten your left / right elbow in front of you with your palm facing down toward the floor. ? If told by your health care provider, bend your left / right elbow to a 90-degree angle (right angle) at your side. 2. With your other hand, gently push over the back of your left / right hand so your fingers point toward the floor (flexion). Stop when you feel a gentle stretch on the back of your forearm. 3. Hold this position for __________ seconds. Repeat __________ times. Complete this exercise __________ times a day. Forearm rotation, supination 1. Sit or stand with your elbows at your side. 2. Bend your left / right elbow to a 90-degree angle (right angle). 3. Using your uninjured hand, turn your left / right palm up toward the ceiling (supination) until you feel a gentle stretch along the inside of your forearm. 4. Hold this position for __________ seconds. Repeat __________ times. Complete this exercise __________ times a day. Forearm rotation, pronation 1. Sit or stand with your elbows at your side. 2. Bend your left / right elbow to a 90-degree angle (right angle). 3. Using your uninjured hand, turn your left / right palm down toward the floor (pronation) until you feel a gentle stretch along the top of your forearm. 4. Hold this position for __________ seconds. Repeat __________ times. Complete this exercise __________ times a day. Strengthening exercises These exercises build  strength and endurance in your elbow. Endurance is the ability to use your muscles for a long time, even after they get tired. Wrist flexion  1. Sit with your left / right forearm supported on a table or other surface and your palm turned up toward the ceiling. Let your left / right wrist extend over the edge of the surface. 2. Hold a __________ weight or a piece of rubber exercise band or tubing. ? If using a rubber exercise band or tubing, hold  the other end of the tubing with your other hand. 3. Slowly bend your wrist so your hand moves up toward the ceiling (flexion). Try to only move your wrist and keep the rest of your arm still. 4. Hold this position for __________ seconds. 5. Slowly return to the starting position. Repeat __________ times. Complete this exercise __________ times a day. Wrist flexion, eccentric 1. Sit with your left / right forearm palm-up and supported on a table or other surface. Let your left / right wrist extend over the edge of the surface. 2. Hold a __________ weight or a piece of rubber exercise band or tubing in your left / right hand. ? If using a rubber exercise band or tubing, hold the other end of the tubing with your other hand. 3. Use your uninjured hand to move your left / right hand up toward the ceiling. 4. Take your uninjured hand away and slowly return to the starting position using only your left / right hand (eccentric flexion). Repeat __________ times. Complete this exercise __________ times a day. Forearm rotation, pronation To do this exercise, you will need a lightweight hammer or rubber mallet. 1. Sit with your left / right forearm supported on a table or other surface. Bend your elbow to a 90-degree angle (right angle). Position your forearm so that your palm is facing up toward the ceiling, with your hand resting over the edge of the table. 2. Hold a hammer in your left / right hand. ? To make this exercise easier, hold the hammer near the head of the hammer. ? To make this exercise harder, hold the hammer near the end of the handle. 3. Without moving your elbow, slowly turn (rotate) your forearm so your palm faces down toward the floor (pronation). 4. Hold this position for __________ seconds. 5. Slowly return to the starting position. Repeat __________ times. Complete this exercise __________ times a day. Shoulder blade squeeze 1. Sit in a stable chair or stand with good posture. If  you are sitting down, do not let your back touch the back of the chair. 2. Your arms should be at your sides with your elbows bent to a 90-degree angle (right angle). Position your forearms so that your thumbs are facing the ceiling (neutral position). 3. Without lifting your shoulders up, squeeze your shoulder blades tightly together. 4. Hold this position for __________ seconds. 5. Slowly release and return to the starting position. Repeat __________ times. Complete this exercise __________ times a day. This information is not intended to replace advice given to you by your health care provider. Make sure you discuss any questions you have with your health care provider. Document Released: 03/17/2005 Document Revised: 07/08/2018 Document Reviewed: 05/11/2018 Elsevier Patient Education  2020 [employer].

## 2019-02-17 DIAGNOSIS — C4441 Basal cell carcinoma of skin of scalp and neck: Secondary | ICD-10-CM | POA: Diagnosis not present

## 2019-02-21 DIAGNOSIS — F325 Major depressive disorder, single episode, in full remission: Secondary | ICD-10-CM | POA: Diagnosis not present

## 2019-02-21 DIAGNOSIS — Z131 Encounter for screening for diabetes mellitus: Secondary | ICD-10-CM | POA: Diagnosis not present

## 2019-02-21 DIAGNOSIS — Z1331 Encounter for screening for depression: Secondary | ICD-10-CM | POA: Diagnosis not present

## 2019-02-21 DIAGNOSIS — K219 Gastro-esophageal reflux disease without esophagitis: Secondary | ICD-10-CM | POA: Diagnosis not present

## 2019-02-21 DIAGNOSIS — R7309 Other abnormal glucose: Secondary | ICD-10-CM | POA: Diagnosis not present

## 2019-02-21 DIAGNOSIS — Z6828 Body mass index (BMI) 28.0-28.9, adult: Secondary | ICD-10-CM | POA: Diagnosis not present

## 2019-02-21 DIAGNOSIS — F419 Anxiety disorder, unspecified: Secondary | ICD-10-CM | POA: Diagnosis not present

## 2019-02-21 DIAGNOSIS — G4700 Insomnia, unspecified: Secondary | ICD-10-CM | POA: Diagnosis not present

## 2019-02-21 DIAGNOSIS — Z1159 Encounter for screening for other viral diseases: Secondary | ICD-10-CM | POA: Diagnosis not present

## 2019-02-23 ENCOUNTER — Other Ambulatory Visit: Payer: Self-pay

## 2019-03-18 DIAGNOSIS — Z23 Encounter for immunization: Secondary | ICD-10-CM | POA: Diagnosis not present

## 2019-03-18 DIAGNOSIS — L661 Lichen planopilaris: Secondary | ICD-10-CM | POA: Diagnosis not present

## 2019-04-18 DIAGNOSIS — D485 Neoplasm of uncertain behavior of skin: Secondary | ICD-10-CM | POA: Diagnosis not present

## 2019-04-18 DIAGNOSIS — L905 Scar conditions and fibrosis of skin: Secondary | ICD-10-CM | POA: Diagnosis not present

## 2019-04-19 DIAGNOSIS — Z86010 Personal history of colonic polyps: Secondary | ICD-10-CM | POA: Diagnosis not present

## 2019-04-19 DIAGNOSIS — K219 Gastro-esophageal reflux disease without esophagitis: Secondary | ICD-10-CM | POA: Diagnosis not present

## 2019-05-04 DIAGNOSIS — L669 Cicatricial alopecia, unspecified: Secondary | ICD-10-CM | POA: Diagnosis not present

## 2019-05-04 DIAGNOSIS — L661 Lichen planopilaris: Secondary | ICD-10-CM | POA: Diagnosis not present

## 2019-05-04 DIAGNOSIS — Z23 Encounter for immunization: Secondary | ICD-10-CM | POA: Diagnosis not present

## 2019-05-16 DIAGNOSIS — H40003 Preglaucoma, unspecified, bilateral: Secondary | ICD-10-CM | POA: Diagnosis not present

## 2019-05-29 DIAGNOSIS — U071 COVID-19: Secondary | ICD-10-CM | POA: Diagnosis not present

## 2019-06-02 ENCOUNTER — Other Ambulatory Visit: Payer: Self-pay | Admitting: Physician Assistant

## 2019-06-02 ENCOUNTER — Ambulatory Visit (HOSPITAL_COMMUNITY)
Admission: RE | Admit: 2019-06-02 | Discharge: 2019-06-02 | Disposition: A | Discharge status: Home / Self Care | Payer: Medicare Other | Source: Ambulatory Visit | Attending: Pulmonary Disease | Admitting: Pulmonary Disease

## 2019-06-02 DIAGNOSIS — R54 Age-related physical debility: Secondary | ICD-10-CM

## 2019-06-02 DIAGNOSIS — U071 COVID-19: Secondary | ICD-10-CM

## 2019-06-02 DIAGNOSIS — Z23 Encounter for immunization: Secondary | ICD-10-CM | POA: Diagnosis not present

## 2019-06-02 MED ORDER — ALBUTEROL SULFATE HFA 108 (90 BASE) MCG/ACT IN AERS: 2.0000 | INHALATION_SPRAY | Freq: Once | RESPIRATORY_TRACT | Status: DC | PRN

## 2019-06-02 MED ORDER — METHYLPREDNISOLONE SODIUM SUCC 125 MG IJ SOLR: 125.0000 mg | Freq: Once | INTRAMUSCULAR | Status: DC | PRN

## 2019-06-02 MED ORDER — SODIUM CHLORIDE 0.9 % IV SOLN
700.0000 mg | Freq: Once | INTRAVENOUS | Status: AC
  Administered 2019-06-02: 700 mg via INTRAVENOUS
  Filled 2019-06-02: qty 20

## 2019-06-02 MED ORDER — EPINEPHRINE 0.3 MG/0.3ML IJ SOAJ: 0.3000 mg | Freq: Once | INTRAMUSCULAR | Status: DC | PRN

## 2019-06-02 MED ORDER — SODIUM CHLORIDE 0.9 % IV SOLN: INTRAVENOUS | Status: DC | PRN

## 2019-06-02 MED ORDER — FAMOTIDINE IN NACL 20-0.9 MG/50ML-% IV SOLN: 20.0000 mg | Freq: Once | INTRAVENOUS | Status: DC | PRN

## 2019-06-02 MED ORDER — DIPHENHYDRAMINE HCL 50 MG/ML IJ SOLN: 50.0000 mg | Freq: Once | INTRAMUSCULAR | Status: DC | PRN

## 2019-06-02 NOTE — Discharge Instructions (Signed)
COVID-19 COVID-19 is a respiratory infection that is caused by a virus called severe acute respiratory syndrome coronavirus 2 (SARS-CoV-2). The disease is also known as coronavirus disease or novel coronavirus. In some people, the virus may not cause any symptoms. In others, it may cause a serious infection. The infection can get worse quickly and can lead to complications, such as:  Pneumonia, or infection of the lungs.  Acute respiratory distress syndrome or ARDS. This is a condition in which fluid build-up in the lungs prevents the lungs from filling with air and passing oxygen into the blood.  Acute respiratory failure. This is a condition in which there is not enough oxygen passing from the lungs to the body or when carbon dioxide is not passing from the lungs out of the body.  Sepsis or septic shock. This is a serious bodily reaction to an infection.  Blood clotting problems.  Secondary infections due to bacteria or fungus.  Organ failure. This is when your body's organs stop working. The virus that causes COVID-19 is contagious. This means that it can spread from person to person through droplets from coughs and sneezes (respiratory secretions). What are the causes? This illness is caused by a virus. You may catch the virus by:  Breathing in droplets from an infected person. Droplets can be spread by a person breathing, speaking, singing, coughing, or sneezing.  Touching something, like a table or a doorknob, that was exposed to the virus (contaminated) and then touching your mouth, nose, or eyes. What increases the risk? Risk for infection You are more likely to be infected with this virus if you:  Are within 6 feet (2 meters) of a person with COVID-19.  Provide care for or live with a person who is infected with COVID-19.  Spend time in crowded indoor spaces or live in shared housing. Risk for serious illness You are more likely to become seriously ill from the virus if  you:  Are 50 years of age or older. The higher your age, the more you are at risk for serious illness.  Live in a nursing home or long-term care facility.  Have cancer.  Have a long-term (chronic) disease such as: ? Chronic lung disease, including chronic obstructive pulmonary disease or asthma. ? A long-term disease that lowers your body's ability to fight infection (immunocompromised). ? Heart disease, including heart failure, a condition in which the arteries that lead to the heart become narrow or blocked (coronary artery disease), a disease which makes the heart muscle thick, weak, or stiff (cardiomyopathy). ? Diabetes. ? Chronic kidney disease. ? Sickle cell disease, a condition in which red blood cells have an abnormal "sickle" shape. ? Liver disease.  Are obese. What are the signs or symptoms? Symptoms of this condition can range from mild to severe. Symptoms may appear any time from 2 to 14 days after being exposed to the virus. They include:  A fever or chills.  A cough.  Difficulty breathing.  Headaches, body aches, or muscle aches.  Runny or stuffy (congested) nose.  A sore throat.  New loss of taste or smell. Some people may also have stomach problems, such as nausea, vomiting, or diarrhea. Other people may not have any symptoms of COVID-19. How is this diagnosed? This condition may be diagnosed based on:  Your signs and symptoms, especially if: ? You live in an area with a COVID-19 outbreak. ? You recently traveled to or from an area where the virus is common. ? You   provide care for or live with a person who was diagnosed with COVID-19. ? You were exposed to a person who was diagnosed with COVID-19.  A physical exam.  Lab tests, which may include: ? Taking a sample of fluid from the back of your nose and throat (nasopharyngeal fluid), your nose, or your throat using a swab. ? A sample of mucus from your lungs (sputum). ? Blood tests.  Imaging tests,  which may include, X-rays, CT scan, or ultrasound. How is this treated? At present, there is no medicine to treat COVID-19. Medicines that treat other diseases are being used on a trial basis to see if they are effective against COVID-19. Your health care provider will talk with you about ways to treat your symptoms. For most people, the infection is mild and can be managed at home with rest, fluids, and over-the-counter medicines. Treatment for a serious infection usually takes places in a hospital intensive care unit (ICU). It may include one or more of the following treatments. These treatments are given until your symptoms improve.  Receiving fluids and medicines through an IV.  Supplemental oxygen. Extra oxygen is given through a tube in the nose, a face mask, or a hood.  Positioning you to lie on your stomach (prone position). This makes it easier for oxygen to get into the lungs.  Continuous positive airway pressure (CPAP) or bi-level positive airway pressure (BPAP) machine. This treatment uses mild air pressure to keep the airways open. A tube that is connected to a motor delivers oxygen to the body.  Ventilator. This treatment moves air into and out of the lungs by using a tube that is placed in your windpipe.  Tracheostomy. This is a procedure to create a hole in the neck so that a breathing tube can be inserted.  Extracorporeal membrane oxygenation (ECMO). This procedure gives the lungs a chance to recover by taking over the functions of the heart and lungs. It supplies oxygen to the body and removes carbon dioxide. Follow these instructions at home: Lifestyle  If you are sick, stay home except to get medical care. Your health care provider will tell you how long to stay home. Call your health care provider before you go for medical care.  Rest at home as told by your health care provider.  Do not use any products that contain nicotine or tobacco, such as cigarettes,  e-cigarettes, and chewing tobacco. If you need help quitting, ask your health care provider.  Return to your normal activities as told by your health care provider. Ask your health care provider what activities are safe for you. General instructions  Take over-the-counter and prescription medicines only as told by your health care provider.  Drink enough fluid to keep your urine pale yellow.  Keep all follow-up visits as told by your health care provider. This is important. How is this prevented?  There is no vaccine to help prevent COVID-19 infection. However, there are steps you can take to protect yourself and others from this virus. To protect yourself:   Do not travel to areas where COVID-19 is a risk. The areas where COVID-19 is reported change often. To identify high-risk areas and travel restrictions, check the CDC travel website: wwwnc.cdc.gov/travel/notices  If you live in, or must travel to, an area where COVID-19 is a risk, take precautions to avoid infection. ? Stay away from people who are sick. ? Wash your hands often with soap and water for 20 seconds. If soap and water   are not available, use an alcohol-based hand sanitizer. ? Avoid touching your mouth, face, eyes, or nose. ? Avoid going out in public, follow guidance from your state and local health authorities. ? If you must go out in public, wear a cloth face covering or face mask. Make sure your mask covers your nose and mouth. ? Avoid crowded indoor spaces. Stay at least 6 feet (2 meters) away from others. ? Disinfect objects and surfaces that are frequently touched every day. This may include:  Counters and tables.  Doorknobs and light switches.  Sinks and faucets.  Electronics, such as phones, remote controls, keyboards, computers, and tablets. To protect others: If you have symptoms of COVID-19, take steps to prevent the virus from spreading to others.  If you think you have a COVID-19 infection, contact  your health care provider right away. Tell your health care team that you think you may have a COVID-19 infection.  Stay home. Leave your house only to seek medical care. Do not use public transport.  Do not travel while you are sick.  Wash your hands often with soap and water for 20 seconds. If soap and water are not available, use alcohol-based hand sanitizer.  Stay away from other members of your household. Let healthy household members care for children and pets, if possible. If you have to care for children or pets, wash your hands often and wear a mask. If possible, stay in your own room, separate from others. Use a different bathroom.  Make sure that all people in your household wash their hands well and often.  Cough or sneeze into a tissue or your sleeve or elbow. Do not cough or sneeze into your hand or into the air.  Wear a cloth face covering or face mask. Make sure your mask covers your nose and mouth. Where to find more information  Centers for Disease Control and Prevention: www.cdc.gov/coronavirus/2019-ncov/index.html  World Health Organization: www.who.int/health-topics/coronavirus Contact a health care provider if:  You live in or have traveled to an area where COVID-19 is a risk and you have symptoms of the infection.  You have had contact with someone who has COVID-19 and you have symptoms of the infection. Get help right away if:  You have trouble breathing.  You have pain or pressure in your chest.  You have confusion.  You have bluish lips and fingernails.  You have difficulty waking from sleep.  You have symptoms that get worse. These symptoms may represent a serious problem that is an emergency. Do not wait to see if the symptoms will go away. Get medical help right away. Call your local emergency services (911 in the [place] Do not drive yourself to the hospital. Let the emergency medical personnel know if you think you have  COVID-19. Summary  COVID-19 is a respiratory infection that is caused by a virus. It is also known as coronavirus disease or novel coronavirus. It can cause serious infections, such as pneumonia, acute respiratory distress syndrome, acute respiratory failure, or sepsis.  The virus that causes COVID-19 is contagious. This means that it can spread from person to person through droplets from breathing, speaking, singing, coughing, or sneezing.  You are more likely to develop a serious illness if you are 50 years of age or older, have a weak immune system, live in a nursing home, or have chronic disease.  There is no medicine to treat COVID-19. Your health care provider will talk with you about ways to treat your symptoms.    Take steps to protect yourself and others from infection. Wash your hands often and disinfect objects and surfaces that are frequently touched every day. Stay away from people who are sick and wear a mask if you are sick. This information is not intended to replace advice given to you by your health care provider. Make sure you discuss any questions you have with your health care provider. Document Revised: 01/14/2019 Document Reviewed: 04/22/2018 Elsevier Patient Education  2020 Elsevier Inc. What types of side effects do monoclonal antibody drugs cause?  Common side effects  In general, the more common side effects caused by monoclonal antibody drugs include: . Allergic reactions, such as hives or itching . Flu-like signs and symptoms, including chills, fatigue, fever, and muscle aches and pains . Nausea, vomiting . Diarrhea . Skin rashes . Low blood pressure   The CDC is recommending patients who receive monoclonal antibody treatments wait at least 90 days before being vaccinated.  Currently, there are no data on the safety and efficacy of mRNA COVID-19 vaccines in persons who received monoclonal antibodies or convalescent plasma as part of COVID-19 treatment. Based  on the estimated half-life of such therapies as well as evidence suggesting that reinfection is uncommon in the 90 days after initial infection, vaccination should be deferred for at least 90 days, as a precautionary measure until additional information becomes available, to avoid interference of the antibody treatment with vaccine-induced immune responses. 

## 2019-06-02 NOTE — Progress Notes (Signed)
  Diagnosis: COVID-19  Physician: Dr. Tia Masker  Procedure: [facility] Med: bamlanivimab infusion - Provided patient with bamlanimivab fact sheet for patients, parents and caregivers prior to infusion.  Complications: No immediate complications noted.  Discharge: Discharged home   Joy Fernandez 06/02/2019

## 2019-06-02 NOTE — Progress Notes (Signed)
  I connected by phone with Joy Fernandez on 06/02/2019 at 12:42 PM to discuss the potential use of an new treatment for mild to moderate COVID-19 viral infection in non-hospitalized patients.  This patient is a 74 y.o. female that meets the FDA criteria for Emergency Use Authorization of bamlanivimab or casirivimab\imdevimab.  Has a (+) direct SARS-CoV-2 viral test result  Has mild or moderate COVID-19   Is ? 74 years of age and weighs ? 40 kg  Is NOT hospitalized due to COVID-19  Is NOT requiring oxygen therapy or requiring an increase in baseline oxygen flow rate due to COVID-19  Is within 10 days of symptom onset  Has at least one of the high risk factor(s) for progression to severe COVID-19 and/or hospitalization as defined in EUA.  Specific high risk criteria : >/= 76 yo   I have spoken and communicated the following to the patient or parent/caregiver:  1. FDA has authorized the emergency use of bamlanivimab and casirivimab\imdevimab for the treatment of mild to moderate COVID-19 in adults and pediatric patients with positive results of direct SARS-CoV-2 viral testing who are 91 years of age and older weighing at least 40 kg, and who are at high risk for progressing to severe COVID-19 and/or hospitalization.  2. The significant known and potential risks and benefits of bamlanivimab and casirivimab\imdevimab, and the extent to which such potential risks and benefits are unknown.  3. Information on available alternative treatments and the risks and benefits of those alternatives, including clinical trials.  4. Patients treated with bamlanivimab and casirivimab\imdevimab should continue to self-isolate and use infection control measures (e.g., wear mask, isolate, social distance, avoid sharing personal items, clean and disinfect "high touch" surfaces, and frequent handwashing) according to CDC guidelines.   5. The patient or parent/caregiver has the option to accept or refuse  bamlanivimab or casirivimab\imdevimab .  After reviewing this information with the patient, The patient agreed to proceed with receiving the bamlanimivab infusion and will be provided a copy of the Fact sheet prior to receiving the infusion.   Sx onset 2/27. Set up for infusion today at 2:30pm. Directions given.   [name] 06/02/2019 12:42 PM

## 2019-06-10 ENCOUNTER — Other Ambulatory Visit: Payer: Self-pay | Admitting: Rheumatology

## 2019-06-10 NOTE — Telephone Encounter (Signed)
Patient recently tested postive for Covid. Patient advised to hold MTX for 4 weeks. Patient is for labs and has a follow up scheduled for 07/19/19.

## 2019-07-14 DIAGNOSIS — Z85828 Personal history of other malignant neoplasm of skin: Secondary | ICD-10-CM | POA: Diagnosis not present

## 2019-07-14 DIAGNOSIS — L570 Actinic keratosis: Secondary | ICD-10-CM | POA: Diagnosis not present

## 2019-07-14 DIAGNOSIS — L821 Other seborrheic keratosis: Secondary | ICD-10-CM | POA: Diagnosis not present

## 2019-07-14 DIAGNOSIS — Z808 Family history of malignant neoplasm of other organs or systems: Secondary | ICD-10-CM | POA: Diagnosis not present

## 2019-07-14 DIAGNOSIS — D224 Melanocytic nevi of scalp and neck: Secondary | ICD-10-CM | POA: Diagnosis not present

## 2019-07-14 DIAGNOSIS — L723 Sebaceous cyst: Secondary | ICD-10-CM | POA: Diagnosis not present

## 2019-07-14 DIAGNOSIS — L578 Other skin changes due to chronic exposure to nonionizing radiation: Secondary | ICD-10-CM | POA: Diagnosis not present

## 2019-07-14 DIAGNOSIS — D225 Melanocytic nevi of trunk: Secondary | ICD-10-CM | POA: Diagnosis not present

## 2019-07-18 NOTE — Progress Notes (Signed)
Office Visit Note  Patient: Joy Fernandez             Date of Birth: 10-12-1945           MRN: [id number]             PCP: Joy Kiel, MD Referring: Joy Kiel, MD Visit Date: 07/25/2019 Occupation: @GUAROCC @  Subjective Medication management   History of Present Illness: Joy Fernandez is a 74 y.o. female with history of rheumatoid arthritis, osteoarthritis and osteoporosis.  She acquired COVID-19 infection in the beginning of March.  She has been off methotrexate since then.  She states she has been doing quite well without methotrexate.  She denies any joint pain or joint swelling.  She was on Boniva for osteoporosis which she stopped after the last bone density as she wants to try exercises only for now.  She would like to wait until her next bone density is performed towards the end of this year.  Activities of Daily Living:  Patient reports morning stiffness for 5 minutes.   Patient Denies nocturnal pain.  Difficulty dressing/grooming: Denies Difficulty climbing stairs: Denies Difficulty getting out of chair: Denies Difficulty using hands for taps, buttons, cutlery, and/or writing: Denies  Review of Systems  Constitutional: Negative for fatigue, night sweats, weight gain and weight loss.  HENT: Negative for mouth sores, trouble swallowing, trouble swallowing, mouth dryness and nose dryness.   Eyes: Positive for dryness. Negative for pain, redness and visual disturbance.  Respiratory: Negative for cough, shortness of breath and difficulty breathing.   Cardiovascular: Negative for chest pain, palpitations, hypertension, irregular heartbeat and swelling in legs/feet.  Gastrointestinal: Negative for blood in stool, constipation and diarrhea.  Endocrine: Negative for cold intolerance and increased urination.  Genitourinary: Negative for difficulty urinating and vaginal dryness.  Musculoskeletal: Positive for morning stiffness. Negative for arthralgias, gait problem,  joint pain, joint swelling, myalgias, muscle weakness, muscle tenderness and myalgias.  Skin: Negative for color change, rash, hair loss, skin tightness, ulcers and sensitivity to sunlight.  Allergic/Immunologic: Negative for susceptible to infections.  Neurological: Positive for dizziness. Negative for memory loss, night sweats and weakness.       History of vertigo  Hematological: Negative for bruising/bleeding tendency and swollen glands.  Psychiatric/Behavioral: Positive for sleep disturbance. Negative for depressed mood. The patient is not nervous/anxious.     PMFS History:  Patient Active Problem List   Diagnosis Date Noted  . History of depression 06/06/2016  . History of gastroesophageal reflux (GERD) 06/06/2016  . Rheumatoid arthritis of multiple sites with negative rheumatoid factor ([place]) 05/30/2016  . Primary osteoarthritis of both feet 05/30/2016  . Primary osteoarthritis of both hands 05/30/2016  . High risk medication use 05/30/2016  . Age-related osteoporosis without current pathological fracture 05/30/2016  . Idiopathic thrombocytopenic purpura ([place]) 02/24/2011    Past Medical History:  Diagnosis Date  . Arthritis   . Depression   . Insomnia   . ITP (idiopathic thrombocytopenic purpura) 02/24/2011  . Osteoporosis   . Rheumatoid arthritis ([place])     Family History  Problem Relation Age of Onset  . Hypertension Mother   . Heart disease Father   . Hypertension Brother   . Depression Daughter   . Depression Son    Past Surgical History:  Procedure Laterality Date  . ABDOMINAL HYSTERECTOMY    . BREAST SURGERY Left    lumpectomy   . CHOLECYSTECTOMY    . MOHS SURGERY     x4  Social History   Social History Narrative  . Not on file    There is no immunization history on file for this patient.   Objective: Vital Signs: BP 130/75 (BP Location: Left Arm, Patient Position: Sitting, Cuff Size: Normal)   Pulse 79   Resp 16   Ht 5' 3.5" (1.613 m)   Wt 151  lb 12.8 oz (68.9 kg)   BMI 26.47 kg/m    Physical Exam Vitals and nursing note reviewed.  Constitutional:      Appearance: She is well-developed.  HENT:     Head: Normocephalic and atraumatic.  Eyes:     Conjunctiva/sclera: Conjunctivae normal.  Cardiovascular:     Rate and Rhythm: Normal rate and regular rhythm.     Heart sounds: Normal heart sounds.  Pulmonary:     Effort: Pulmonary effort is normal.     Breath sounds: Normal breath sounds.  Abdominal:     General: Bowel sounds are normal.     Palpations: Abdomen is soft.  Musculoskeletal:     Cervical back: Normal range of motion.  Lymphadenopathy:     Cervical: No cervical adenopathy.  Skin:    General: Skin is warm and dry.     Capillary Refill: Capillary refill takes less than 2 seconds.  Neurological:     Mental Status: She is alert and oriented to person, place, and time.  Psychiatric:        Behavior: Behavior normal.      Musculoskeletal Exam: C-spine has some limitation of range of motion and stiffness.  Shoulder joints and elbow joints with good range of motion.  She had no synovitis over MCPs or wrist joints.  She has PIP and DIP thickening consistent with osteoarthritis.  She had good range of motion of her hip joints knee joints ankles and MTPs.  No synovitis was noted.  CDAI Exam: CDAI Score: 0.2  Patient Global: 1 mm; Provider Global: 1 mm Swollen: 0 ; Tender: 0  Joint Exam 07/25/2019   No joint exam has been documented for this visit   There is currently no information documented on the homunculus. Go to the Rheumatology activity and complete the homunculus joint exam.  Investigation: No additional findings.  Imaging: No results found.  Recent Labs: Lab Results  Component Value Date   WBC 6.9 01/12/2019   HGB 15.5 01/12/2019   PLT 177 01/12/2019   NA 141 01/12/2019   K 4.1 01/12/2019   CL 105 01/12/2019   CO2 26 01/12/2019   GLUCOSE 149 (H) 01/12/2019   BUN 18 01/12/2019   CREATININE  0.80 01/12/2019   BILITOT 0.5 01/12/2019   ALKPHOS 73 07/24/2016   AST 23 01/12/2019   ALT 21 01/12/2019   PROT 6.3 01/12/2019   ALBUMIN 3.9 07/24/2016   CALCIUM 10.2 01/12/2019   GFRAA 84 01/12/2019    Speciality Comments: No specialty comments available.  Procedures:  No procedures performed Allergies: Other and Codeine   Assessment / Plan:     Visit Diagnoses: Rheumatoid arthritis of multiple sites with negative rheumatoid factor (HCC)-patient has been off methotrexate since the beginning of March due to COVID-19 infection.  She denies having any flare.  She does not recall when the last time she had a flare.  We discussed keeping her off methotrexate as she is doing well.  If she starts having any increased symptoms we will resume methotrexate.  I will obtain some labs today in case she has a flare and she has to  go back on methotrexate.  Patient is supposed to notify us in case she develops increased joint pain or swelling.  High risk medication use - Methotrexate 2.5 mg 6 tablets every 7 days and folic acid 1 mg 2 tablets daily (discontinued March 2020).  - Plan: CBC with Differential/Platelet, COMPLETE METABOLIC PANEL WITH GFR  Primary osteoarthritis of both hands-she has DIP and PIP thickening.  Primary osteoarthritis of both feet-she is osteoarthritic changes but no synovitis.  Age-related osteoporosis without current pathological fracture - discontinued Boniva after her recent DEXA on 01/18/18. , DEXA 01/18/2018 BMD: 0.526, T-score: -2.8. DEXA 12/25/2015 T score -3.31 October 2013 T score was -3.5.  Patient decided not to take Boniva after her last DEXA.  She wants to try exercises and calcium and vitamin D.  She will make the decision to go back on Boniva after the DEXA scan this year.  History of gastroesophageal reflux (GERD)  Idiopathic thrombocytopenic purpura ([place])  History of depression  Orders: Orders Placed This Encounter  Procedures  . CBC with  Differential/Platelet  . COMPLETE METABOLIC PANEL WITH GFR   No orders of the defined types were placed in this encounter.   Face-to-face time spent with patient was 30 minutes. Greater than 50% of time was spent in counseling and coordination of care.  Follow-Up Instructions: Return in about 3 months (around 10/24/2019) for Rheumatoid arthritis, Osteoarthritis.   Bo Merino, MD  Note - This record has been created using Editor, commissioning.  Chart creation errors have been sought, but may not always  have been located. Such creation errors do not reflect on  the standard of medical care.

## 2019-07-25 ENCOUNTER — Other Ambulatory Visit: Payer: Self-pay

## 2019-07-25 ENCOUNTER — Ambulatory Visit: Payer: Medicare Other | Admitting: Rheumatology

## 2019-07-25 ENCOUNTER — Encounter: Payer: Self-pay | Admitting: Physician Assistant

## 2019-07-25 VITALS — BP 130/75 | HR 79 | Resp 16 | Ht 63.5 in | Wt 151.8 lb

## 2019-07-25 DIAGNOSIS — M19071 Primary osteoarthritis, right ankle and foot: Secondary | ICD-10-CM | POA: Diagnosis not present

## 2019-07-25 DIAGNOSIS — M19042 Primary osteoarthritis, left hand: Secondary | ICD-10-CM

## 2019-07-25 DIAGNOSIS — M7701 Medial epicondylitis, right elbow: Secondary | ICD-10-CM

## 2019-07-25 DIAGNOSIS — Z79899 Other long term (current) drug therapy: Secondary | ICD-10-CM | POA: Diagnosis not present

## 2019-07-25 DIAGNOSIS — M810 Age-related osteoporosis without current pathological fracture: Secondary | ICD-10-CM

## 2019-07-25 DIAGNOSIS — Z8659 Personal history of other mental and behavioral disorders: Secondary | ICD-10-CM

## 2019-07-25 DIAGNOSIS — Z8719 Personal history of other diseases of the digestive system: Secondary | ICD-10-CM | POA: Diagnosis not present

## 2019-07-25 DIAGNOSIS — M0609 Rheumatoid arthritis without rheumatoid factor, multiple sites: Secondary | ICD-10-CM

## 2019-07-25 DIAGNOSIS — M19041 Primary osteoarthritis, right hand: Secondary | ICD-10-CM | POA: Diagnosis not present

## 2019-07-25 DIAGNOSIS — D693 Immune thrombocytopenic purpura: Secondary | ICD-10-CM

## 2019-07-25 DIAGNOSIS — M19072 Primary osteoarthritis, left ankle and foot: Secondary | ICD-10-CM

## 2019-07-25 DIAGNOSIS — Z9181 History of falling: Secondary | ICD-10-CM

## 2019-07-25 LAB — CBC WITH DIFFERENTIAL/PLATELET
Absolute Monocytes: 674 cells/uL (ref 200–950)
Basophils Absolute: 32 cells/uL (ref 0–200)
Basophils Relative: 0.5 %
Eosinophils Absolute: 82 cells/uL (ref 15–500)
Eosinophils Relative: 1.3 %
HCT: 48.1 % — ABNORMAL HIGH (ref 35.0–45.0)
Hemoglobin: 16 g/dL — ABNORMAL HIGH (ref 11.7–15.5)
Lymphs Abs: 1751 cells/uL (ref 850–3900)
MCH: 32.8 pg (ref 27.0–33.0)
MCHC: 33.3 g/dL (ref 32.0–36.0)
MCV: 98.6 fL (ref 80.0–100.0)
MPV: 11.8 fL (ref 7.5–12.5)
Monocytes Relative: 10.7 %
Neutro Abs: 3761 cells/uL (ref 1500–7800)
Neutrophils Relative %: 59.7 %
Platelets: 155 10*3/uL (ref 140–400)
RBC: 4.88 10*6/uL (ref 3.80–5.10)
RDW: 12.1 % (ref 11.0–15.0)
Total Lymphocyte: 27.8 %
WBC: 6.3 10*3/uL (ref 3.8–10.8)

## 2019-07-25 LAB — COMPLETE METABOLIC PANEL WITH GFR
AG Ratio: 1.5 (calc) (ref 1.0–2.5)
ALT: 17 U/L (ref 6–29)
AST: 22 U/L (ref 10–35)
Albumin: 3.9 g/dL (ref 3.6–5.1)
Alkaline phosphatase (APISO): 84 U/L (ref 37–153)
BUN/Creatinine Ratio: 23 (calc) — ABNORMAL HIGH (ref 6–22)
BUN: 23 mg/dL (ref 7–25)
CO2: 29 mmol/L (ref 20–32)
Calcium: 10.9 mg/dL — ABNORMAL HIGH (ref 8.6–10.4)
Chloride: 108 mmol/L (ref 98–110)
Creat: 0.99 mg/dL — ABNORMAL HIGH (ref 0.60–0.93)
GFR, Est African American: 65 mL/min/{1.73_m2} (ref >=60)
GFR, Est Non African American: 56 mL/min/{1.73_m2} — ABNORMAL LOW (ref >=60)
Globulin: 2.6 g/dL (calc) (ref 1.9–3.7)
Glucose, Bld: 62 mg/dL — ABNORMAL LOW (ref 65–99)
Potassium: 4.7 mmol/L (ref 3.5–5.3)
Sodium: 144 mmol/L (ref 135–146)
Total Bilirubin: 0.6 mg/dL (ref 0.2–1.2)
Total Protein: 6.5 g/dL (ref 6.1–8.1)

## 2019-07-25 NOTE — Patient Instructions (Signed)
Standing Labs We placed an order today for your standing lab work.    Please come back and get your standing labs in July and every 3 months  We have open lab daily Monday through Thursday from 8:30-12:30 PM and 1:30-4:30 PM and Friday from 8:30-12:30 PM and 1:30-4:00 PM at the office of Dr. Aziza Stuckert.   You may experience shorter wait times on Monday and Friday afternoons. The office is located at [address], [address], [place], [place] [address] No appointment is necessary.   Labs are drawn by [facility].  You may receive a bill from [facility] for your lab work.  If you wish to have your labs drawn at another location, please call the office 24 hours in advance to send orders.  If you have any questions regarding directions or hours of operation,  please call [phone].   Just as a reminder please drink plenty of water prior to coming for your lab work. Thanks!  

## 2019-07-26 NOTE — Progress Notes (Signed)
CBC is stable.  CMP shows mild decrease in GFR.  Patient has been off methotrexate.  Calcium is mildly elevated.  Please advise patient to discontinue calcium supplement.

## 2019-07-30 DIAGNOSIS — H8112 Benign paroxysmal vertigo, left ear: Secondary | ICD-10-CM | POA: Diagnosis not present

## 2019-07-30 DIAGNOSIS — N289 Disorder of kidney and ureter, unspecified: Secondary | ICD-10-CM | POA: Diagnosis not present

## 2019-07-30 DIAGNOSIS — R350 Frequency of micturition: Secondary | ICD-10-CM | POA: Diagnosis not present

## 2019-07-30 DIAGNOSIS — R110 Nausea: Secondary | ICD-10-CM | POA: Diagnosis not present

## 2019-08-26 DIAGNOSIS — R0602 Shortness of breath: Secondary | ICD-10-CM | POA: Diagnosis not present

## 2019-08-26 DIAGNOSIS — R42 Dizziness and giddiness: Secondary | ICD-10-CM | POA: Diagnosis not present

## 2019-08-26 DIAGNOSIS — K219 Gastro-esophageal reflux disease without esophagitis: Secondary | ICD-10-CM | POA: Diagnosis not present

## 2019-08-26 DIAGNOSIS — R7989 Other specified abnormal findings of blood chemistry: Secondary | ICD-10-CM | POA: Diagnosis not present

## 2019-08-26 DIAGNOSIS — E781 Pure hyperglyceridemia: Secondary | ICD-10-CM | POA: Diagnosis not present

## 2019-08-26 DIAGNOSIS — R5383 Other fatigue: Secondary | ICD-10-CM | POA: Diagnosis not present

## 2019-08-26 DIAGNOSIS — M059 Rheumatoid arthritis with rheumatoid factor, unspecified: Secondary | ICD-10-CM | POA: Diagnosis not present

## 2019-08-26 DIAGNOSIS — G4700 Insomnia, unspecified: Secondary | ICD-10-CM | POA: Diagnosis not present

## 2019-08-26 DIAGNOSIS — F419 Anxiety disorder, unspecified: Secondary | ICD-10-CM | POA: Diagnosis not present

## 2019-08-26 DIAGNOSIS — R079 Chest pain, unspecified: Secondary | ICD-10-CM | POA: Diagnosis not present

## 2019-08-26 DIAGNOSIS — Z1331 Encounter for screening for depression: Secondary | ICD-10-CM | POA: Diagnosis not present

## 2019-08-26 DIAGNOSIS — M79602 Pain in left arm: Secondary | ICD-10-CM | POA: Diagnosis not present

## 2019-08-26 DIAGNOSIS — Z7189 Other specified counseling: Secondary | ICD-10-CM | POA: Diagnosis not present

## 2019-09-28 DIAGNOSIS — Z1231 Encounter for screening mammogram for malignant neoplasm of breast: Secondary | ICD-10-CM | POA: Diagnosis not present

## 2019-10-13 NOTE — Progress Notes (Signed)
Office Visit Note  Patient: Joy Fernandez             Date of Birth: 1946/02/13           MRN: [id number]             PCP: Ernestene Kiel, MD Referring: Ernestene Kiel, MD Visit Date: 10/25/2019 Occupation: @GUAROCC @  Subjective:  Occasional knee joint pain   History of Present Illness: Joy Fernandez is a 74 y.o. female with history of seronegative rheumatoid arthritis, osteoarthritis, and osteoporosis.  Patient has been off of methotrexate since March 2020.  She initially held MTX due the concern for covid-19 virus.  She states she was diagnosed with covid-19 and had only received 1 of the covid-19 vaccines at that point. She has not received her second vaccine yet.  At her follow-up visit on 07/25/2019 she was advised to continue to hold methotrexate since she is clinically doing well. She denies any recent rheumatoid arthritis flares. She denies any joint pain or joint swelling at this time.  She has minimal morning stiffness.  She occasionally experiences aching in her knee joints, which she attributes to rainy weather.  She denies any warmth or swelling during these episodes.  She walks on a regular basis for exercise.   Activities of Daily Living:  Patient reports morning stiffness for 5-10 minutes.   Patient Denies nocturnal pain.  Difficulty dressing/grooming: Denies Difficulty climbing stairs: Denies Difficulty getting out of chair: Denies Difficulty using hands for taps, buttons, cutlery, and/or writing: Denies  Review of Systems  Constitutional: Negative for fatigue.  HENT: Negative for mouth sores, mouth dryness and nose dryness.   Eyes: Positive for dryness. Negative for pain, itching and visual disturbance.  Respiratory: Negative for cough, hemoptysis, shortness of breath and difficulty breathing.   Cardiovascular: Negative for chest pain, palpitations, hypertension and swelling in legs/feet.  Gastrointestinal: Negative for blood in stool, constipation and diarrhea.    Endocrine: Negative for increased urination.  Genitourinary: Negative for difficulty urinating and painful urination.  Musculoskeletal: Positive for morning stiffness. Negative for arthralgias, joint pain, joint swelling, myalgias, muscle weakness, muscle tenderness and myalgias.  Skin: Negative for color change, pallor, rash, hair loss, nodules/bumps, redness, skin tightness, ulcers and sensitivity to sunlight.  Allergic/Immunologic: Negative for susceptible to infections.  Neurological: Negative for dizziness, numbness, headaches, memory loss and weakness.  Hematological: Negative for bruising/bleeding tendency and swollen glands.  Psychiatric/Behavioral: Negative for depressed mood, confusion and sleep disturbance. The patient is not nervous/anxious.     PMFS History:  Patient Active Problem List   Diagnosis Date Noted  . History of depression 06/06/2016  . History of gastroesophageal reflux (GERD) 06/06/2016  . Rheumatoid arthritis of multiple sites with negative rheumatoid factor ([place]) 05/30/2016  . Primary osteoarthritis of both feet 05/30/2016  . Primary osteoarthritis of both hands 05/30/2016  . High risk medication use 05/30/2016  . Age-related osteoporosis without current pathological fracture 05/30/2016  . Idiopathic thrombocytopenic purpura ([place]) 02/24/2011    Past Medical History:  Diagnosis Date  . Arthritis   . Depression   . Insomnia   . ITP (idiopathic thrombocytopenic purpura) 02/24/2011  . Osteoporosis   . Rheumatoid arthritis ([place])     Family History  Problem Relation Age of Onset  . Hypertension Mother   . Heart disease Father   . Hypertension Brother   . Depression Daughter   . Depression Son    Past Surgical History:  Procedure Laterality Date  . ABDOMINAL HYSTERECTOMY    .  BREAST SURGERY Left    lumpectomy   . CHOLECYSTECTOMY    . MOHS SURGERY     x4   Social History   Social History Narrative  . Not on file    There is no immunization  history on file for this patient.   Objective: Vital Signs: BP 128/75 (BP Location: Left Arm, Patient Position: Sitting, Cuff Size: Normal)   Pulse 68   Resp 14   Ht 5\' 3"  (1.6 m)   Wt 141 lb 12.8 oz (64.3 kg)   BMI 25.12 kg/m    Physical Exam Vitals and nursing note reviewed.  Constitutional:      Appearance: She is well-developed.  HENT:     Head: Normocephalic and atraumatic.  Eyes:     Conjunctiva/sclera: Conjunctivae normal.  Pulmonary:     Effort: Pulmonary effort is normal.  Abdominal:     General: Bowel sounds are normal.     Palpations: Abdomen is soft.  Musculoskeletal:     Cervical back: Normal range of motion.  Lymphadenopathy:     Cervical: No cervical adenopathy.  Skin:    General: Skin is warm and dry.     Capillary Refill: Capillary refill takes less than 2 seconds.  Neurological:     Mental Status: She is alert and oriented to person, place, and time.  Psychiatric:        Behavior: Behavior normal.      Musculoskeletal Exam: C-spine, thoracic spine, and lumbar spine good ROM.  Shoulder joints, elbow joints, wrist joints, MCPs, PIPs, and DIPs good ROM with no synovitis.  PIP and DIP thickening consistent with osteoarthritis of both hands.  [place]mond joint prominence bilaterally.  Hip joints, knee joints, ankle joints, MTPs, PIPs, and DIPs good ROM with no synovitis.  No warmth or effusion of knee joints.  No tenderness or swelling of ankle joints.   CDAI Exam: CDAI Score: 0.2  Patient Global: 1 mm; Provider Global: 1 mm Swollen: 0 ; Tender: 0  Joint Exam 10/25/2019   No joint exam has been documented for this visit   There is currently no information documented on the homunculus. Go to the Rheumatology activity and complete the homunculus joint exam.  Investigation: No additional findings.  Imaging: XR Foot 2 Views Left  Result Date: 10/25/2019 First MTP, PIP and DIP narrowing was noted.  No intertarsal, tibiotalar or subtalar joint space narrowing  was noted.  Inferior calcaneal spur was noted.  No erosive changes were noted.  No radiographic progression was noted when compared to the x-rays of June 02, 2017. Impression: These findings are consistent with osteoarthritis of the foot.  XR Foot 2 Views Right  Result Date: 10/25/2019 First MTP, PIP and DIP narrowing was noted.  No intertarsal, tibiotalar or subtalar joint space narrowing was noted.  Inferior calcaneal spur was noted.  No erosive changes were noted.  No radiographic progression was noted when compared to the x-rays of June 02, 2017. Impression: These findings are consistent with osteoarthritis of the foot.  XR Hand 2 View Left  Result Date: 10/25/2019 CMC, PIP and DIP narrowing was noted.  Narrowing of second and third MCP joints was noted.  Intercarpal and radiocarpal joint space narrowing was noted.  Cystic changes are noted in the carpal bones.  No radiographic progression was noted when compared to the x-rays of June 02, 2017. Impression: These findings are consistent with rheumatoid arthritis and osteoarthritis overlap.  XR Hand 2 View Right  Result Date: 10/25/2019 Severe  CMC PIP and DIP narrowing was noted.  Severe narrowing of second and third MCP joints was noted.  Intercarpal radiocarpal joint space narrowing was noted.  Cystic changes were noted in the carpal bones.  No erosive changes were noted.  No radiographic progression was noted when compared to the x-rays of June 02, 2017. Impression: These findings are consistent with severe rheumatoid arthritis and osteoarthritis overlap.   Recent Labs: Lab Results  Component Value Date   WBC 6.3 07/25/2019   HGB 16.0 (H) 07/25/2019   PLT 155 07/25/2019   NA 144 07/25/2019   K 4.7 07/25/2019   CL 108 07/25/2019   CO2 29 07/25/2019   GLUCOSE 62 (L) 07/25/2019   BUN 23 07/25/2019   CREATININE 0.99 (H) 07/25/2019   BILITOT 0.6 07/25/2019   ALKPHOS 73 07/24/2016   AST 22 07/25/2019   ALT 17 07/25/2019   PROT 6.5  07/25/2019   ALBUMIN 3.9 07/24/2016   CALCIUM 10.9 (H) 07/25/2019   GFRAA 65 07/25/2019    Speciality Comments: No specialty comments available.  Procedures:  No procedures performed Allergies: Other and Codeine   Assessment / Plan:     Visit Diagnoses: Rheumatoid arthritis of multiple sites with negative rheumatoid factor ([place]) -She has not had any recent rheumatoid arthritis flares.  She has no synovitis or joint tenderness on exam.  She has been off of Methotrexate since March 2020.  She initially discontinued MTX due to the concern for the covid-19 virus, and then at her follow up visit on 07/25/19 she did not restart MTX since she continued to be doing well.  She has not noticed any increased joint pain, stiffness, or inflammation since being off of MTX.  She has occasional discomfort in her knee joints but has good ROM with no warmth or effusion on exam.  She was given a handout of knee joint exercises to perform.  X-rays of both hands and both feet were updated today and there was no radiographic progression when compared to images from 06/02/17. She does not require immunosuppressive therapy at this time.  She was advised to notify us if she develops increased joint pain or joint swelling.  She will follow up in 6 months.  Plan: XR Hand 2 View Left, XR Hand 2 View Right, XR Foot 2 Views Right, XR Foot 2 Views Left  High risk medication use - Discontinued MTX (6 tabs/wk) in March 2020-Concern for covid/clinically doing well.   Primary osteoarthritis of both hands: She has PIP and DIP thickening consistent with osteoarthritis of both hands.  CMC joint prominence noted bilaterally.  She has complete fist formation bilaterally.  She has not been having any joint pain or inflammation in her hands recently.    Primary osteoarthritis of both feet: She is not having any discomfort in her feet.  She wears proper fitting shoes.   Age-related osteoporosis without current pathological fracture -  discontinued Boniva after her recent DEXA on 01/18/18. DEXA 01/18/2018 BMD: 0.526, T-score: -2.8. DEXA 12/25/2015 T score -3.31 October 2013 T score was -3.5.  She will be due to update her DEXA in October 2021.  Future order placed today.  - Plan: DG BONE DENSITY (DXA)  Other medical conditions are listed as follows:   History of gastroesophageal reflux (GERD)  History of depression  Idiopathic thrombocytopenic purpura ([place])  Orders: Orders Placed This Encounter  Procedures  . XR Hand 2 View Left  . XR Hand 2 View Right  . XR Foot  2 Views Right  . XR Foot 2 Views Left  . DG BONE DENSITY (DXA)   No orders of the defined types were placed in this encounter.   Follow-Up Instructions: Return in about 6 months (around 04/26/2020) for Rheumatoid arthritis.   Bo Merino, MD   Scribed by-  Hazel Sams, PA-C  Note - This record has been created using Dragon software.  Chart creation errors have been sought, but may not always  have been located. Such creation errors do not reflect on  the standard of medical care.

## 2019-10-19 DIAGNOSIS — D582 Other hemoglobinopathies: Secondary | ICD-10-CM | POA: Diagnosis not present

## 2019-10-19 DIAGNOSIS — K219 Gastro-esophageal reflux disease without esophagitis: Secondary | ICD-10-CM | POA: Diagnosis not present

## 2019-10-19 DIAGNOSIS — G4700 Insomnia, unspecified: Secondary | ICD-10-CM | POA: Diagnosis not present

## 2019-10-19 DIAGNOSIS — F419 Anxiety disorder, unspecified: Secondary | ICD-10-CM | POA: Diagnosis not present

## 2019-10-19 DIAGNOSIS — Z6825 Body mass index (BMI) 25.0-25.9, adult: Secondary | ICD-10-CM | POA: Diagnosis not present

## 2019-10-25 ENCOUNTER — Other Ambulatory Visit: Payer: Self-pay

## 2019-10-25 ENCOUNTER — Ambulatory Visit: Payer: Self-pay

## 2019-10-25 ENCOUNTER — Ambulatory Visit: Payer: PPO | Admitting: Rheumatology

## 2019-10-25 ENCOUNTER — Encounter: Payer: Self-pay | Admitting: Rheumatology

## 2019-10-25 VITALS — BP 128/75 | HR 68 | Resp 14 | Ht 63.0 in | Wt 141.8 lb

## 2019-10-25 DIAGNOSIS — M0609 Rheumatoid arthritis without rheumatoid factor, multiple sites: Secondary | ICD-10-CM | POA: Diagnosis not present

## 2019-10-25 DIAGNOSIS — Z8719 Personal history of other diseases of the digestive system: Secondary | ICD-10-CM

## 2019-10-25 DIAGNOSIS — Z8659 Personal history of other mental and behavioral disorders: Secondary | ICD-10-CM | POA: Diagnosis not present

## 2019-10-25 DIAGNOSIS — D693 Immune thrombocytopenic purpura: Secondary | ICD-10-CM | POA: Diagnosis not present

## 2019-10-25 DIAGNOSIS — M19042 Primary osteoarthritis, left hand: Secondary | ICD-10-CM | POA: Diagnosis not present

## 2019-10-25 DIAGNOSIS — Z79899 Other long term (current) drug therapy: Secondary | ICD-10-CM | POA: Diagnosis not present

## 2019-10-25 DIAGNOSIS — M19071 Primary osteoarthritis, right ankle and foot: Secondary | ICD-10-CM

## 2019-10-25 DIAGNOSIS — M810 Age-related osteoporosis without current pathological fracture: Secondary | ICD-10-CM

## 2019-10-25 DIAGNOSIS — M19072 Primary osteoarthritis, left ankle and foot: Secondary | ICD-10-CM | POA: Diagnosis not present

## 2019-10-25 DIAGNOSIS — M19041 Primary osteoarthritis, right hand: Secondary | ICD-10-CM | POA: Diagnosis not present

## 2019-10-25 NOTE — Patient Instructions (Addendum)
Vaccines You are taking a medication(s) that can suppress your immune system.  The following immunizations are recommended: . Flu annually . Covid-19  o Please continue to wear your mask, practice social distancing and hand hygiene if you are on any medications that suppress your immune system o If you are on methotrexate, Cellcept (mycophenolate), Rinvoq, Morrie Sheldon, and Olumiant - Hold medication for 1 week after each vaccine dose o If you are on Orencia Subcutaneous injections - Hold medication both one week prior to and one week after the first vaccine dose (only) o If you are on Orencia IV infusions - Time vaccine administration so that the first vaccination will occur four weeks after infusion . Pneumonia (Pneumovax 23 and Prevnar 13 spaced at least 1 year apart) . Shingrix (after age 7)  Please check with your PCP to make sure you are up to date.   If you develop COVID-19 please contact our office are you are PCPs office to get guidance regarding COVID-19 antibody infusion.    Journal for Nurse Practitioners, 15(4), [phone]. Retrieved January 04, 2018 from http://clinicalkey.com/nursing">  Knee Exercises Ask your health care provider which exercises are safe for you. Do exercises exactly as told by your health care provider and adjust them as directed. It is normal to feel mild stretching, pulling, tightness, or discomfort as you do these exercises. Stop right away if you feel sudden pain or your pain gets worse. Do not begin these exercises until told by your health care provider. Stretching and range-of-motion exercises These exercises warm up your muscles and joints and improve the movement and flexibility of your knee. These exercises also help to relieve pain and swelling. Knee extension, prone 1. Lie on your abdomen (prone position) on a bed. 2. Place your left / right knee just beyond the edge of the surface so your knee is not on the bed. You can put a towel under your left /  right thigh just above your kneecap for comfort. 3. Relax your leg muscles and allow gravity to straighten your knee (extension). You should feel a stretch behind your left / right knee. 4. Hold this position for __________ seconds. 5. Scoot up so your knee is supported between repetitions. Repeat __________ times. Complete this exercise __________ times a day. Knee flexion, active  1. Lie on your back with both legs straight. If this causes back discomfort, bend your left / right knee so your foot is flat on the floor. 2. Slowly slide your left / right heel back toward your buttocks. Stop when you feel a gentle stretch in the front of your knee or thigh (flexion). 3. Hold this position for __________ seconds. 4. Slowly slide your left / right heel back to the starting position. Repeat __________ times. Complete this exercise __________ times a day. Quadriceps stretch, prone  1. Lie on your abdomen on a firm surface, such as a bed or padded floor. 2. Bend your left / right knee and hold your ankle. If you cannot reach your ankle or pant leg, loop a belt around your foot and grab the belt instead. 3. Gently pull your heel toward your buttocks. Your knee should not slide out to the side. You should feel a stretch in the front of your thigh and knee (quadriceps). 4. Hold this position for __________ seconds. Repeat __________ times. Complete this exercise __________ times a day. Hamstring, supine 1. Lie on your back (supine position). 2. Loop a belt or towel over the ball of your  left / right foot. The ball of your foot is on the walking surface, right under your toes. 3. Straighten your left / right knee and slowly pull on the belt to raise your leg until you feel a gentle stretch behind your knee (hamstring). ? Do not let your knee bend while you do this. ? Keep your other leg flat on the floor. 4. Hold this position for __________ seconds. Repeat __________ times. Complete this exercise  __________ times a day. Strengthening exercises These exercises build strength and endurance in your knee. Endurance is the ability to use your muscles for a long time, even after they get tired. Quadriceps, isometric This exercise stretches the muscles in front of your thigh (quadriceps) without moving your knee joint (isometric). 1. Lie on your back with your left / right leg extended and your other knee bent. Put a rolled towel or small pillow under your knee if told by your health care provider. 2. Slowly tense the muscles in the front of your left / right thigh. You should see your kneecap slide up toward your hip or see increased dimpling just above the knee. This motion will push the back of the knee toward the floor. 3. For __________ seconds, hold the muscle as tight as you can without increasing your pain. 4. Relax the muscles slowly and completely. Repeat __________ times. Complete this exercise __________ times a day. Straight leg raises This exercise stretches the muscles in front of your thigh (quadriceps) and the muscles that move your hips (hip flexors). 1. Lie on your back with your left / right leg extended and your other knee bent. 2. Tense the muscles in the front of your left / right thigh. You should see your kneecap slide up or see increased dimpling just above the knee. Your thigh may even shake a bit. 3. Keep these muscles tight as you raise your leg 4-6 inches (10-15 cm) off the floor. Do not let your knee bend. 4. Hold this position for __________ seconds. 5. Keep these muscles tense as you lower your leg. 6. Relax your muscles slowly and completely after each repetition. Repeat __________ times. Complete this exercise __________ times a day. Hamstring, isometric 1. Lie on your back on a firm surface. 2. Bend your left / right knee about __________ degrees. 3. Dig your left / right heel into the surface as if you are trying to pull it toward your buttocks. Tighten the  muscles in the back of your thighs (hamstring) to "dig" as hard as you can without increasing any pain. 4. Hold this position for __________ seconds. 5. Release the tension gradually and allow your muscles to relax completely for __________ seconds after each repetition. Repeat __________ times. Complete this exercise __________ times a day. Hamstring curls If told by your health care provider, do this exercise while wearing ankle weights. Begin with __________ lb weights. Then increase the weight by 1 lb (0.5 kg) increments. Do not wear ankle weights that are more than __________ lb. 1. Lie on your abdomen with your legs straight. 2. Bend your left / right knee as far as you can without feeling pain. Keep your hips flat against the floor. 3. Hold this position for __________ seconds. 4. Slowly lower your leg to the starting position. Repeat __________ times. Complete this exercise __________ times a day. Squats This exercise strengthens the muscles in front of your thigh and knee (quadriceps). 1. Stand in front of a table, with your feet and knees  pointing straight ahead. You may rest your hands on the table for balance but not for support. 2. Slowly bend your knees and lower your hips like you are going to sit in a chair. ? Keep your weight over your heels, not over your toes. ? Keep your lower legs upright so they are parallel with the table legs. ? Do not let your hips go lower than your knees. ? Do not bend lower than told by your health care provider. ? If your knee pain increases, do not bend as low. 3. Hold the squat position for __________ seconds. 4. Slowly push with your legs to return to standing. Do not use your hands to pull yourself to standing. Repeat __________ times. Complete this exercise __________ times a day. Wall slides This exercise strengthens the muscles in front of your thigh and knee (quadriceps). 1. Lean your back against a smooth wall or door, and walk your feet  out 18-24 inches (46-61 cm) from it. 2. Place your feet hip-width apart. 3. Slowly slide down the wall or door until your knees bend __________ degrees. Keep your knees over your heels, not over your toes. Keep your knees in line with your hips. 4. Hold this position for __________ seconds. Repeat __________ times. Complete this exercise __________ times a day. Straight leg raises This exercise strengthens the muscles that rotate the leg at the hip and move it away from your body (hip abductors). 1. Lie on your side with your left / right leg in the top position. Lie so your head, shoulder, knee, and hip line up. You may bend your bottom knee to help you keep your balance. 2. Roll your hips slightly forward so your hips are stacked directly over each other and your left / right knee is facing forward. 3. Leading with your heel, lift your top leg 4-6 inches (10-15 cm). You should feel the muscles in your outer hip lifting. ? Do not let your foot drift forward. ? Do not let your knee roll toward the ceiling. 4. Hold this position for __________ seconds. 5. Slowly return your leg to the starting position. 6. Let your muscles relax completely after each repetition. Repeat __________ times. Complete this exercise __________ times a day. Straight leg raises This exercise stretches the muscles that move your hips away from the front of the pelvis (hip extensors). 1. Lie on your abdomen on a firm surface. You can put a pillow under your hips if that is more comfortable. 2. Tense the muscles in your buttocks and lift your left / right leg about 4-6 inches (10-15 cm). Keep your knee straight as you lift your leg. 3. Hold this position for __________ seconds. 4. Slowly lower your leg to the starting position. 5. Let your leg relax completely after each repetition. Repeat __________ times. Complete this exercise __________ times a day. This information is not intended to replace advice given to you by  your health care provider. Make sure you discuss any questions you have with your health care provider. Document Revised: 01/05/2018 Document Reviewed: 01/05/2018 Elsevier Patient Education  2020 [employer].

## 2019-11-18 DIAGNOSIS — Z85828 Personal history of other malignant neoplasm of skin: Secondary | ICD-10-CM | POA: Diagnosis not present

## 2019-11-18 DIAGNOSIS — L570 Actinic keratosis: Secondary | ICD-10-CM | POA: Diagnosis not present

## 2019-11-29 DIAGNOSIS — Z79899 Other long term (current) drug therapy: Secondary | ICD-10-CM | POA: Diagnosis not present

## 2019-11-29 DIAGNOSIS — D582 Other hemoglobinopathies: Secondary | ICD-10-CM | POA: Diagnosis not present

## 2019-11-29 DIAGNOSIS — Z20822 Contact with and (suspected) exposure to covid-19: Secondary | ICD-10-CM | POA: Diagnosis not present

## 2019-12-25 DIAGNOSIS — N3091 Cystitis, unspecified with hematuria: Secondary | ICD-10-CM | POA: Diagnosis not present

## 2019-12-25 DIAGNOSIS — N3001 Acute cystitis with hematuria: Secondary | ICD-10-CM | POA: Diagnosis not present

## 2020-01-06 DIAGNOSIS — Z1331 Encounter for screening for depression: Secondary | ICD-10-CM | POA: Diagnosis not present

## 2020-01-06 DIAGNOSIS — Z1339 Encounter for screening examination for other mental health and behavioral disorders: Secondary | ICD-10-CM | POA: Diagnosis not present

## 2020-01-06 DIAGNOSIS — Z0000 Encounter for general adult medical examination without abnormal findings: Secondary | ICD-10-CM | POA: Diagnosis not present

## 2020-01-06 DIAGNOSIS — D582 Other hemoglobinopathies: Secondary | ICD-10-CM | POA: Diagnosis not present

## 2020-01-06 DIAGNOSIS — R7989 Other specified abnormal findings of blood chemistry: Secondary | ICD-10-CM | POA: Diagnosis not present

## 2020-01-06 DIAGNOSIS — E785 Hyperlipidemia, unspecified: Secondary | ICD-10-CM | POA: Diagnosis not present

## 2020-01-30 DIAGNOSIS — M85851 Other specified disorders of bone density and structure, right thigh: Secondary | ICD-10-CM | POA: Diagnosis not present

## 2020-01-30 DIAGNOSIS — M85852 Other specified disorders of bone density and structure, left thigh: Secondary | ICD-10-CM | POA: Diagnosis not present

## 2020-02-03 ENCOUNTER — Telehealth: Payer: Self-pay | Admitting: *Deleted

## 2020-02-03 DIAGNOSIS — Z79899 Other long term (current) drug therapy: Secondary | ICD-10-CM

## 2020-02-03 DIAGNOSIS — M810 Age-related osteoporosis without current pathological fracture: Secondary | ICD-10-CM

## 2020-02-03 NOTE — Telephone Encounter (Signed)
Received DEXA results from [facility].  Date of Scan: 01/30/2020 Lowest T-score and site measured: -2.6 Left Femoral Neck Significant changes in BMD and site measured (5% and above): -8 % Right total Femur, -7% Left total femur  Current Regimen: patient was on Boniva and was discontinued after DEXA in 2019.  Recommendation: Resume Boniva.   Attempted to contact the patient and left message for patient to call the office.

## 2020-02-06 NOTE — Telephone Encounter (Signed)
Discussed with Dr. Estanislado Pandy. Please update CBC, CMP, and vitamin D prior to restarting on boniva.

## 2020-02-06 NOTE — Telephone Encounter (Signed)
Patient advised and future order placed.

## 2020-02-06 NOTE — Addendum Note (Signed)
Addended by: Carole Binning on: 02/06/2020 01:04 PM   Modules accepted: Orders

## 2020-02-14 DIAGNOSIS — Z86010 Personal history of colonic polyps: Secondary | ICD-10-CM | POA: Diagnosis not present

## 2020-02-14 DIAGNOSIS — K219 Gastro-esophageal reflux disease without esophagitis: Secondary | ICD-10-CM | POA: Diagnosis not present

## 2020-02-16 DIAGNOSIS — C4441 Basal cell carcinoma of skin of scalp and neck: Secondary | ICD-10-CM | POA: Diagnosis not present

## 2020-02-16 DIAGNOSIS — L723 Sebaceous cyst: Secondary | ICD-10-CM | POA: Diagnosis not present

## 2020-02-16 DIAGNOSIS — L821 Other seborrheic keratosis: Secondary | ICD-10-CM | POA: Diagnosis not present

## 2020-02-16 DIAGNOSIS — L578 Other skin changes due to chronic exposure to nonionizing radiation: Secondary | ICD-10-CM | POA: Diagnosis not present

## 2020-02-16 DIAGNOSIS — Z85828 Personal history of other malignant neoplasm of skin: Secondary | ICD-10-CM | POA: Diagnosis not present

## 2020-02-16 DIAGNOSIS — D224 Melanocytic nevi of scalp and neck: Secondary | ICD-10-CM | POA: Diagnosis not present

## 2020-02-16 DIAGNOSIS — L719 Rosacea, unspecified: Secondary | ICD-10-CM | POA: Diagnosis not present

## 2020-02-16 DIAGNOSIS — Z808 Family history of malignant neoplasm of other organs or systems: Secondary | ICD-10-CM | POA: Diagnosis not present

## 2020-02-16 DIAGNOSIS — D225 Melanocytic nevi of trunk: Secondary | ICD-10-CM | POA: Diagnosis not present

## 2020-03-05 ENCOUNTER — Other Ambulatory Visit: Payer: Self-pay | Admitting: Rheumatology

## 2020-03-05 ENCOUNTER — Other Ambulatory Visit: Payer: Self-pay

## 2020-03-05 DIAGNOSIS — Z79899 Other long term (current) drug therapy: Secondary | ICD-10-CM

## 2020-03-05 DIAGNOSIS — M810 Age-related osteoporosis without current pathological fracture: Secondary | ICD-10-CM

## 2020-03-06 ENCOUNTER — Other Ambulatory Visit: Payer: Self-pay | Admitting: Rheumatology

## 2020-03-06 DIAGNOSIS — D582 Other hemoglobinopathies: Secondary | ICD-10-CM

## 2020-03-06 LAB — CBC WITH DIFFERENTIAL/PLATELET
Absolute Monocytes: 737 cells/uL (ref 200–950)
Basophils Absolute: 23 cells/uL (ref 0–200)
Basophils Relative: 0.3 %
Eosinophils Absolute: 30 cells/uL (ref 15–500)
Eosinophils Relative: 0.4 %
HCT: 48.9 % — ABNORMAL HIGH (ref 35.0–45.0)
Hemoglobin: 16.7 g/dL — ABNORMAL HIGH (ref 11.7–15.5)
Lymphs Abs: 2189 cells/uL (ref 850–3900)
MCH: 32.9 pg (ref 27.0–33.0)
MCHC: 34.2 g/dL (ref 32.0–36.0)
MCV: 96.4 fL (ref 80.0–100.0)
MPV: 11.8 fL (ref 7.5–12.5)
Monocytes Relative: 9.7 %
Neutro Abs: 4621 cells/uL (ref 1500–7800)
Neutrophils Relative %: 60.8 %
Platelets: 177 10*3/uL (ref 140–400)
RBC: 5.07 10*6/uL (ref 3.80–5.10)
RDW: 11.7 % (ref 11.0–15.0)
Total Lymphocyte: 28.8 %
WBC: 7.6 10*3/uL (ref 3.8–10.8)

## 2020-03-06 LAB — COMPLETE METABOLIC PANEL WITH GFR
AG Ratio: 1.8 (calc) (ref 1.0–2.5)
ALT: 15 U/L (ref 6–29)
AST: 23 U/L (ref 10–35)
Albumin: 4.3 g/dL (ref 3.6–5.1)
Alkaline phosphatase (APISO): 90 U/L (ref 37–153)
BUN: 17 mg/dL (ref 7–25)
CO2: 25 mmol/L (ref 20–32)
Calcium: 11 mg/dL — ABNORMAL HIGH (ref 8.6–10.4)
Chloride: 106 mmol/L (ref 98–110)
Creat: 0.83 mg/dL (ref 0.60–0.93)
GFR, Est African American: 79 mL/min/{1.73_m2} (ref >=60)
GFR, Est Non African American: 68 mL/min/{1.73_m2} (ref >=60)
Globulin: 2.4 g/dL (calc) (ref 1.9–3.7)
Glucose, Bld: 76 mg/dL (ref 65–99)
Potassium: 4.3 mmol/L (ref 3.5–5.3)
Sodium: 143 mmol/L (ref 135–146)
Total Bilirubin: 0.4 mg/dL (ref 0.2–1.2)
Total Protein: 6.7 g/dL (ref 6.1–8.1)

## 2020-03-06 LAB — VITAMIN D 25 HYDROXY (VIT D DEFICIENCY, FRACTURES): Vit D, 25-Hydroxy: 30 ng/mL (ref 30–100)

## 2020-03-06 NOTE — Progress Notes (Signed)
Calcium is elevated.  Please advise patient not to take calcium supplement.  Vitamin D is normal.  Hemoglobin is elevated.  We will get iron studies at the follow-up visit.

## 2020-03-07 DIAGNOSIS — Z1152 Encounter for screening for COVID-19: Secondary | ICD-10-CM | POA: Diagnosis not present

## 2020-03-07 DIAGNOSIS — Z1159 Encounter for screening for other viral diseases: Secondary | ICD-10-CM | POA: Diagnosis not present

## 2020-03-14 DIAGNOSIS — Z86010 Personal history of colonic polyps: Secondary | ICD-10-CM | POA: Diagnosis not present

## 2020-03-14 DIAGNOSIS — K259 Gastric ulcer, unspecified as acute or chronic, without hemorrhage or perforation: Secondary | ICD-10-CM | POA: Diagnosis not present

## 2020-03-14 DIAGNOSIS — Z1211 Encounter for screening for malignant neoplasm of colon: Secondary | ICD-10-CM | POA: Diagnosis not present

## 2020-03-14 DIAGNOSIS — K2950 Unspecified chronic gastritis without bleeding: Secondary | ICD-10-CM | POA: Diagnosis not present

## 2020-03-14 DIAGNOSIS — K219 Gastro-esophageal reflux disease without esophagitis: Secondary | ICD-10-CM | POA: Diagnosis not present

## 2020-03-14 DIAGNOSIS — K2901 Acute gastritis with bleeding: Secondary | ICD-10-CM | POA: Diagnosis not present

## 2020-03-14 DIAGNOSIS — K3189 Other diseases of stomach and duodenum: Secondary | ICD-10-CM | POA: Diagnosis not present

## 2020-03-14 DIAGNOSIS — K2971 Gastritis, unspecified, with bleeding: Secondary | ICD-10-CM | POA: Diagnosis not present

## 2020-03-14 DIAGNOSIS — K5730 Diverticulosis of large intestine without perforation or abscess without bleeding: Secondary | ICD-10-CM | POA: Diagnosis not present

## 2020-03-14 DIAGNOSIS — K289 Gastrojejunal ulcer, unspecified as acute or chronic, without hemorrhage or perforation: Secondary | ICD-10-CM | POA: Diagnosis not present

## 2020-03-14 DIAGNOSIS — K2210 Ulcer of esophagus without bleeding: Secondary | ICD-10-CM | POA: Diagnosis not present

## 2020-04-09 DIAGNOSIS — Z86010 Personal history of colonic polyps: Secondary | ICD-10-CM | POA: Diagnosis not present

## 2020-04-09 DIAGNOSIS — K219 Gastro-esophageal reflux disease without esophagitis: Secondary | ICD-10-CM | POA: Diagnosis not present

## 2020-04-09 DIAGNOSIS — K5730 Diverticulosis of large intestine without perforation or abscess without bleeding: Secondary | ICD-10-CM | POA: Diagnosis not present

## 2020-04-10 NOTE — Progress Notes (Signed)
Office Visit Note  Patient: Joy Fernandez             Date of Birth: April 23, 1945           MRN: [id number]             PCP: Ernestene Kiel, MD Referring: Ernestene Kiel, MD Visit Date: 04/24/2020 Occupation: @GUAROCC @  Subjective:  Joint stiffness.   History of Present Illness: Joy Fernandez is a 75 y.o. female with history of rheumatoid arthritis and osteoarthritis.  She has been off methotrexate for almost 2 years now.  She denies any joint swelling.  She states she has some stiffness from underlying osteoarthritis but no particular discomfort.  She had first Covid vaccine.  She states she had COVID-19 infection a month later and received monoclonal antibody infusion.  She does not want to have anymore vaccines.  She has side effects from the vaccine and the antibody infusion.  Activities of Daily Living:  Patient reports morning stiffness for 5-10 minutes.   Patient Denies nocturnal pain.  Difficulty dressing/grooming: Denies Difficulty climbing stairs: Denies Difficulty getting out of chair: Denies Difficulty using hands for taps, buttons, cutlery, and/or writing: Denies  Review of Systems  Constitutional: Negative for fatigue.  HENT: Negative for mouth sores, mouth dryness and nose dryness.   Eyes: Positive for dryness. Negative for pain, itching and visual disturbance.  Respiratory: Negative for cough, hemoptysis, shortness of breath and difficulty breathing.   Cardiovascular: Negative for chest pain, palpitations and swelling in legs/feet.  Gastrointestinal: Negative for abdominal pain, blood in stool, constipation and diarrhea.  Endocrine: Negative for increased urination.  Genitourinary: Negative for painful urination.  Musculoskeletal: Positive for arthralgias, joint pain and morning stiffness. Negative for joint swelling, myalgias, muscle weakness, muscle tenderness and myalgias.  Skin: Negative for color change, rash and redness.  Allergic/Immunologic: Negative for  susceptible to infections.  Neurological: Positive for dizziness. Negative for numbness, headaches, memory loss and weakness.  Hematological: Negative for swollen glands.  Psychiatric/Behavioral: Negative for confusion and sleep disturbance.    PMFS History:  Patient Active Problem List   Diagnosis Date Noted  . History of depression 06/06/2016  . History of gastroesophageal reflux (GERD) 06/06/2016  . Rheumatoid arthritis of multiple sites with negative rheumatoid factor ([place]) 05/30/2016  . Primary osteoarthritis of both feet 05/30/2016  . Primary osteoarthritis of both hands 05/30/2016  . High risk medication use 05/30/2016  . Age-related osteoporosis without current pathological fracture 05/30/2016  . Idiopathic thrombocytopenic purpura ([place]) 02/24/2011    Past Medical History:  Diagnosis Date  . Arthritis   . Depression   . Insomnia   . ITP (idiopathic thrombocytopenic purpura) 02/24/2011  . Osteoporosis   . Rheumatoid arthritis ([place])     Family History  Problem Relation Age of Onset  . Hypertension Mother   . Heart disease Father   . Hypertension Brother   . Depression Daughter   . Depression Son    Past Surgical History:  Procedure Laterality Date  . ABDOMINAL HYSTERECTOMY    . BREAST SURGERY Left    lumpectomy   . CHOLECYSTECTOMY    . MOHS SURGERY     x4   Social History   Social History Narrative  . Not on file   Immunization History  Administered Date(s) Administered  . Moderna Sars-Covid-2 Vaccination 05/02/2019     Objective: Vital Signs: BP 115/69 (BP Location: Left Arm, Patient Position: Sitting, Cuff Size: Normal)   Pulse 76   Ht 5'  2.5" (1.588 m)   Wt 142 lb 9.6 oz (64.7 kg)   BMI 25.67 kg/m    Physical Exam Vitals and nursing note reviewed.  Constitutional:      Appearance: She is well-developed and well-nourished.  HENT:     Head: Normocephalic and atraumatic.  Eyes:     Extraocular Movements: EOM normal.     Conjunctiva/sclera:  Conjunctivae normal.  Cardiovascular:     Rate and Rhythm: Normal rate and regular rhythm.     Pulses: Intact distal pulses.     Heart sounds: Normal heart sounds.  Pulmonary:     Effort: Pulmonary effort is normal.     Breath sounds: Normal breath sounds.  Abdominal:     General: Bowel sounds are normal.     Palpations: Abdomen is soft.  Musculoskeletal:     Cervical back: Normal range of motion.  Lymphadenopathy:     Cervical: No cervical adenopathy.  Skin:    General: Skin is warm and dry.     Capillary Refill: Capillary refill takes less than 2 seconds.  Neurological:     Mental Status: She is alert and oriented to person, place, and time.  Psychiatric:        Mood and Affect: Mood and affect normal.        Behavior: Behavior normal.      Musculoskeletal Exam: C-spine thoracic and lumbar spine with good range of motion.  Shoulder joints, elbow joints, wrist joints were in good range of motion.  She had no synovitis over wrist joints or MCPs.  She has DIP and PIP prominence but no synovitis was noted.  Hip joints and knee joints with good range of motion.  There was no tenderness across MTPs.  CDAI Exam: CDAI Score: -- Patient Global: --; Provider Global: -- Swollen: --; Tender: -- Joint Exam 04/24/2020   No joint exam has been documented for this visit   There is currently no information documented on the homunculus. Go to the Rheumatology activity and complete the homunculus joint exam.  Investigation: No additional findings.  Imaging: No results found.  Recent Labs: Lab Results  Component Value Date   WBC 7.6 03/05/2020   HGB 16.7 (H) 03/05/2020   PLT 177 03/05/2020   NA 143 03/05/2020   K 4.3 03/05/2020   CL 106 03/05/2020   CO2 25 03/05/2020   GLUCOSE 76 03/05/2020   BUN 17 03/05/2020   CREATININE 0.83 03/05/2020   BILITOT 0.4 03/05/2020   ALKPHOS 73 07/24/2016   AST 23 03/05/2020   ALT 15 03/05/2020   PROT 6.7 03/05/2020   ALBUMIN 3.9 07/24/2016    CALCIUM 11.0 (H) 03/05/2020   GFRAA 79 03/05/2020    Speciality Comments: No specialty comments available.  Procedures:  No procedures performed Allergies: Other and Codeine   Assessment / Plan:     Visit Diagnoses: Rheumatoid arthritis of multiple sites with negative rheumatoid factor (HCC)-patient had no synovitis on examination.  She has been off methotrexate since March 2020.  I have advised her to contact us in case her symptoms recur.  High risk medication use - Discontinued MTX (6 tabs/wk) in March 2020-Concern for covid/clinically doing well.  She had elevated hemoglobin recently.  She states her PCP is following 5.  Primary osteoarthritis of both hands-she has some osteoarthritis and continues to have stiffness.  Primary osteoarthritis of both feet-she had no tenderness over MTPs.  Age-related osteoporosis without current pathological fracture - DEXA 01/30/20 T-score: -2.6, BMD: 0.559 left  femoral neck. discontinued Boniva after her recent DEXA on 01/18/18.  Patient does not want to start Boniva at this point.  Her calcium was elevated and she has been off calcium and vitamin D.  History of depression  History of gastroesophageal reflux (GERD)  Idiopathic thrombocytopenic purpura ([place])  COVID-19 virus infection - 05/2019.  Patient had only one vaccination.  She received monoclonal antibodies after the infection.  She states she also had COVID-19 antibody titers which were sufficient.  She does not want to get second dose of the vaccination.  Use of mask, social distancing and hand hygiene was emphasized.  Orders: No orders of the defined types were placed in this encounter.  No orders of the defined types were placed in this encounter.    Follow-Up Instructions: Return for Rheumatoid arthritis.   Bo Merino, MD  Note - This record has been created using Editor, commissioning.  Chart creation errors have been sought, but may not always  have been located. Such  creation errors do not reflect on  the standard of medical care.

## 2020-04-24 ENCOUNTER — Other Ambulatory Visit: Payer: Self-pay

## 2020-04-24 ENCOUNTER — Encounter: Payer: Self-pay | Admitting: Rheumatology

## 2020-04-24 ENCOUNTER — Ambulatory Visit (INDEPENDENT_AMBULATORY_CARE_PROVIDER_SITE_OTHER): Payer: Medicare Other | Admitting: Rheumatology

## 2020-04-24 VITALS — BP 115/69 | HR 76 | Ht 62.5 in | Wt 142.6 lb

## 2020-04-24 DIAGNOSIS — M810 Age-related osteoporosis without current pathological fracture: Secondary | ICD-10-CM

## 2020-04-24 DIAGNOSIS — M19072 Primary osteoarthritis, left ankle and foot: Secondary | ICD-10-CM

## 2020-04-24 DIAGNOSIS — M19071 Primary osteoarthritis, right ankle and foot: Secondary | ICD-10-CM

## 2020-04-24 DIAGNOSIS — Z8659 Personal history of other mental and behavioral disorders: Secondary | ICD-10-CM

## 2020-04-24 DIAGNOSIS — M19041 Primary osteoarthritis, right hand: Secondary | ICD-10-CM

## 2020-04-24 DIAGNOSIS — M0609 Rheumatoid arthritis without rheumatoid factor, multiple sites: Secondary | ICD-10-CM | POA: Diagnosis not present

## 2020-04-24 DIAGNOSIS — U071 COVID-19: Secondary | ICD-10-CM

## 2020-04-24 DIAGNOSIS — M19042 Primary osteoarthritis, left hand: Secondary | ICD-10-CM

## 2020-04-24 DIAGNOSIS — Z8719 Personal history of other diseases of the digestive system: Secondary | ICD-10-CM

## 2020-04-24 DIAGNOSIS — Z79899 Other long term (current) drug therapy: Secondary | ICD-10-CM | POA: Diagnosis not present

## 2020-04-24 DIAGNOSIS — D693 Immune thrombocytopenic purpura: Secondary | ICD-10-CM

## 2020-05-21 DIAGNOSIS — H40013 Open angle with borderline findings, low risk, bilateral: Secondary | ICD-10-CM | POA: Diagnosis not present

## 2020-08-13 DIAGNOSIS — R070 Pain in throat: Secondary | ICD-10-CM | POA: Diagnosis not present

## 2020-08-13 DIAGNOSIS — J040 Acute laryngitis: Secondary | ICD-10-CM | POA: Diagnosis not present

## 2020-09-03 DIAGNOSIS — F419 Anxiety disorder, unspecified: Secondary | ICD-10-CM | POA: Diagnosis not present

## 2020-09-03 DIAGNOSIS — G4700 Insomnia, unspecified: Secondary | ICD-10-CM | POA: Diagnosis not present

## 2020-09-03 DIAGNOSIS — R238 Other skin changes: Secondary | ICD-10-CM | POA: Diagnosis not present

## 2020-09-03 DIAGNOSIS — K219 Gastro-esophageal reflux disease without esophagitis: Secondary | ICD-10-CM | POA: Diagnosis not present

## 2020-09-08 DIAGNOSIS — J309 Allergic rhinitis, unspecified: Secondary | ICD-10-CM | POA: Diagnosis not present

## 2020-09-08 DIAGNOSIS — H1010 Acute atopic conjunctivitis, unspecified eye: Secondary | ICD-10-CM | POA: Diagnosis not present

## 2020-09-08 DIAGNOSIS — R058 Other specified cough: Secondary | ICD-10-CM | POA: Diagnosis not present

## 2020-09-12 DIAGNOSIS — T1592XA Foreign body on external eye, part unspecified, left eye, initial encounter: Secondary | ICD-10-CM | POA: Diagnosis not present

## 2020-09-17 DIAGNOSIS — T1512XD Foreign body in conjunctival sac, left eye, subsequent encounter: Secondary | ICD-10-CM | POA: Diagnosis not present

## 2020-10-02 DIAGNOSIS — R300 Dysuria: Secondary | ICD-10-CM | POA: Diagnosis not present

## 2020-10-03 DIAGNOSIS — Z1231 Encounter for screening mammogram for malignant neoplasm of breast: Secondary | ICD-10-CM | POA: Diagnosis not present

## 2020-10-16 ENCOUNTER — Telehealth: Payer: Self-pay | Admitting: Oncology

## 2020-10-16 NOTE — Telephone Encounter (Signed)
Patient referred by Dr Ernestene Kiel for Elevated Hgb.  Appt made for 11/09/20 Labs 2:45 pm - Consult 3:15 pm

## 2020-11-01 DIAGNOSIS — D2261 Melanocytic nevi of right upper limb, including shoulder: Secondary | ICD-10-CM | POA: Diagnosis not present

## 2020-11-01 DIAGNOSIS — Z85828 Personal history of other malignant neoplasm of skin: Secondary | ICD-10-CM | POA: Diagnosis not present

## 2020-11-01 DIAGNOSIS — Z808 Family history of malignant neoplasm of other organs or systems: Secondary | ICD-10-CM | POA: Diagnosis not present

## 2020-11-01 DIAGNOSIS — D224 Melanocytic nevi of scalp and neck: Secondary | ICD-10-CM | POA: Diagnosis not present

## 2020-11-08 ENCOUNTER — Other Ambulatory Visit: Payer: Self-pay | Admitting: Oncology

## 2020-11-08 DIAGNOSIS — D45 Polycythemia vera: Secondary | ICD-10-CM

## 2020-11-08 NOTE — Progress Notes (Signed)
[facility]  [address] [place],  [place]  [id number] [phone]  Clinic Day:  11/09/2020  Referring physician: Ernestene Kiel, MD   HISTORY OF PRESENT ILLNESS:  The patient is a 75 y.o. female  who I was asked to consult upon for polycythemia.  Labs in June 2022 showed an elevated hemoglobin of 17.1.  Per prior labs, her hemoglobin has been elevated since at least 2019.  She denies a history of smoking.  She denies being on a diuretic or anabolic steroid, both of which can lead to polycythemia.  She denies having undergone a previous splenectomy.  She claims her brother also has polycythemia.  She denies having headaches, vision changes or other hyperviscosity issues as it pertains to her polycythemia.    PAST MEDICAL HISTORY:   Past Medical History:  Diagnosis Date   Arthritis    Depression    Insomnia    ITP (idiopathic thrombocytopenic purpura) 02/24/2011   Osteoporosis    Rheumatoid arthritis ([place])     PAST SURGICAL HISTORY:   Past Surgical History:  Procedure Laterality Date   ABDOMINAL HYSTERECTOMY     BREAST SURGERY Left    lumpectomy    CHOLECYSTECTOMY     MOHS SURGERY     x4    CURRENT MEDICATIONS:   Current Outpatient Medications  Medication Sig Dispense Refill   ALPRAZolam (XANAX) 0.25 MG tablet Take 0.25 mg by mouth 3 (three) times daily as needed.     BIOTIN PO Take by mouth daily.     meclizine (ANTIVERT) 25 MG tablet Take 25 mg by mouth 3 (three) times daily as needed.     OVER THE COUNTER MEDICATION daily.     pantoprazole (PROTONIX) 40 MG tablet Take 40 mg by mouth daily as needed.      Probiotic Product (PROBIOTIC PO) Take by mouth.     zolpidem (AMBIEN) 10 MG tablet Take 10 mg by mouth at bedtime as needed.     No current facility-administered medications for this visit.    ALLERGIES:   Allergies  Allergen Reactions   Other Hives and Swelling    Synthetic codeine,given in liquid form for cough.    Codeine Rash    FAMILY HISTORY:   Family History  Problem Relation Age of Onset   Hypertension Mother    Heart disease Father    Hypertension Brother    Depression Daughter    Depression Son     SOCIAL HISTORY:  The patient was born and raised in [place].  She lives [place] town with her husband of 32 years.  They have 3 children and 4 grandchildren.  She did Psychologist, educational work for 35 years.  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   REVIEW OF SYSTEMS:  Review of Systems  Constitutional:  Negative for fatigue and fever.  HENT:   Negative for hearing loss and sore throat.   Eyes:  Negative for eye problems.  Respiratory:  Negative for chest tightness, cough and hemoptysis.   Cardiovascular:  Negative for chest pain and palpitations.  Gastrointestinal:  Positive for constipation. Negative for abdominal distention, abdominal pain, blood in stool, diarrhea, nausea and vomiting.  Endocrine: Negative for hot flashes.  Genitourinary:  Negative for difficulty urinating, dysuria, frequency, hematuria and nocturia.   Musculoskeletal:  Negative for arthralgias, back pain, gait problem and myalgias.  Skin: Negative.  Negative for itching and rash.  Neurological:  Positive for dizziness. Negative for extremity weakness, gait problem, headaches, light-headedness and numbness.  Hematological: Negative.   Psychiatric/Behavioral:  Positive for depression. Negative for suicidal ideas. The patient is nervous/anxious.     PHYSICAL EXAM:  Blood pressure 137/77, pulse 71, temperature 98.4 F (36.9 C), resp. rate 16, height 5' 2.5" (1.588 m), weight 146 lb 1.6 oz (66.3 kg), SpO2 98 %. Wt Readings from Last 3 Encounters:  11/09/20 146 lb 1.6 oz (66.3 kg)  04/24/20 142 lb 9.6 oz (64.7 kg)  10/25/19 141 lb 12.8 oz (64.3 kg)   Body mass index is 26.3 kg/m. Performance status (ECOG): 0 - Asymptomatic Physical Exam Constitutional:      Appearance:  Normal appearance. She is not ill-appearing.  HENT:     Mouth/Throat:     Mouth: Mucous membranes are moist.     Pharynx: Oropharynx is clear. No oropharyngeal exudate or posterior oropharyngeal erythema.  Cardiovascular:     Rate and Rhythm: Normal rate and regular rhythm.     Heart sounds: No murmur heard.   No friction rub. No gallop.  Pulmonary:     Effort: Pulmonary effort is normal. No respiratory distress.     Breath sounds: Normal breath sounds. No wheezing, rhonchi or rales.  Abdominal:     General: Bowel sounds are normal. There is no distension.     Palpations: Abdomen is soft. There is no mass.     Tenderness: There is no abdominal tenderness.  Musculoskeletal:        General: No swelling.     Right lower leg: No edema.     Left lower leg: No edema.  Lymphadenopathy:     Cervical: No cervical adenopathy.     Upper Body:     Right upper body: No supraclavicular or axillary adenopathy.     Left upper body: No supraclavicular or axillary adenopathy.     Lower Body: No right inguinal adenopathy. No left inguinal adenopathy.  Skin:    General: Skin is warm.     Coloration: Skin is not jaundiced.     Findings: No lesion or rash.  Neurological:     General: No focal deficit present.     Mental Status: She is alert and oriented to person, place, and time. Mental status is at baseline.     Cranial Nerves: Cranial nerves are intact.  Psychiatric:        Mood and Affect: Mood normal.        Behavior: Behavior normal.        Thought Content: Thought content normal.   LABS:   CBC Latest Ref Rng & Units 11/09/2020 03/05/2020 07/25/2019  WBC - 6.9 7.6 6.3  Hemoglobin 12.0 - 16.0 15.5 16.7(H) 16.0(H)  Hematocrit 36 - 46 46 48.9(H) 48.1(H)  Platelets 150 - 399 145(A) 177 155   CMP Latest Ref Rng & Units 11/09/2020 03/05/2020 07/25/2019  Glucose 65 - 99 mg/dL - 76 62(L)  BUN 4 - '21 15 17 23  '$ Creatinine 0.5 - 1.1 0.8 0.83 0.99(H)  Sodium 137 - 147 139 143 144  Potassium 3.4 -  5.3 3.8 4.3 4.7  Chloride 99 - 108 106 106 108  CO2 13 - 22 23(A) 25 29  Calcium 8.7 - 10.7 10.2 11.0(H) 10.9(H)  Total Protein 6.1 - 8.1 g/dL - 6.7 6.5  Total Bilirubin 0.2 - 1.2 mg/dL - 0.4 0.6  Alkaline Phos 25 - 125 90 - -  AST 13 - 35 31 23 22  ALT 7 - 35 '23 15 17   '$ Lab Results  Component Value Date   TIBC 286 11/09/2020   FERRITIN 225 11/09/2020   IRONPCTSAT 33 (H) 11/09/2020   ASSESSMENT & PLAN:  A 75 y.o. female who I was asked to consult upon for polycythemia.  I am pleased as her hemoglobin and other red cell indices are completely normal today.  There is nothing per her history or physical exam to suggest any ominous issue is behind her previously elevated hemoglobin.  Overall the patient is doing well.  As she has no pressing hematologic issues, I do feel comfortable turning her care back over to her primary care office.  I would not have a problem seeing her in the future if new hematologic issues develop that require repeat clinical assessment.  The patient understands all the plans discussed today and is in agreement with them.  I do appreciate Ernestene Kiel, MD for his new consult.   Rein Popov Macarthur Critchley, MD

## 2020-11-09 ENCOUNTER — Encounter: Payer: Self-pay | Admitting: Oncology

## 2020-11-09 ENCOUNTER — Other Ambulatory Visit: Payer: Self-pay

## 2020-11-09 ENCOUNTER — Inpatient Hospital Stay (INDEPENDENT_AMBULATORY_CARE_PROVIDER_SITE_OTHER): Payer: Medicare Other | Admitting: Oncology

## 2020-11-09 ENCOUNTER — Inpatient Hospital Stay: Payer: Medicare Other | Attending: Oncology

## 2020-11-09 DIAGNOSIS — D751 Secondary polycythemia: Secondary | ICD-10-CM

## 2020-11-09 DIAGNOSIS — K5900 Constipation, unspecified: Secondary | ICD-10-CM | POA: Diagnosis not present

## 2020-11-09 DIAGNOSIS — Z79899 Other long term (current) drug therapy: Secondary | ICD-10-CM

## 2020-11-09 DIAGNOSIS — Z818 Family history of other mental and behavioral disorders: Secondary | ICD-10-CM | POA: Insufficient documentation

## 2020-11-09 DIAGNOSIS — Z9049 Acquired absence of other specified parts of digestive tract: Secondary | ICD-10-CM | POA: Diagnosis not present

## 2020-11-09 DIAGNOSIS — F32A Depression, unspecified: Secondary | ICD-10-CM

## 2020-11-09 DIAGNOSIS — R42 Dizziness and giddiness: Secondary | ICD-10-CM | POA: Diagnosis not present

## 2020-11-09 DIAGNOSIS — Z8249 Family history of ischemic heart disease and other diseases of the circulatory system: Secondary | ICD-10-CM | POA: Diagnosis not present

## 2020-11-09 DIAGNOSIS — Z8659 Personal history of other mental and behavioral disorders: Secondary | ICD-10-CM | POA: Diagnosis not present

## 2020-11-09 DIAGNOSIS — Z885 Allergy status to narcotic agent status: Secondary | ICD-10-CM | POA: Insufficient documentation

## 2020-11-09 DIAGNOSIS — D45 Polycythemia vera: Secondary | ICD-10-CM | POA: Insufficient documentation

## 2020-11-09 LAB — IRON AND TIBC
Iron: 94 ug/dL (ref 28–170)
Saturation Ratios: 33 % — ABNORMAL HIGH (ref 10.4–31.8)
TIBC: 286 ug/dL (ref 250–450)
UIBC: 192 ug/dL

## 2020-11-09 LAB — FERRITIN: Ferritin: 225 ng/mL (ref 11–307)

## 2020-11-09 LAB — BASIC METABOLIC PANEL
BUN: 15 (ref 4–21)
CO2: 23 — AB (ref 13–22)
Chloride: 106 (ref 99–108)
Creatinine: 0.8 (ref 0.5–1.1)
Glucose: 116
Potassium: 3.8 (ref 3.4–5.3)
Sodium: 139 (ref 137–147)

## 2020-11-09 LAB — CBC AND DIFFERENTIAL
HCT: 46 (ref 36–46)
Hemoglobin: 15.5 (ref 12.0–16.0)
Neutrophils Absolute: 4.14
Platelets: 145 — AB (ref 150–399)
WBC: 6.9

## 2020-11-09 LAB — HEPATIC FUNCTION PANEL
ALT: 23 (ref 7–35)
AST: 31 (ref 13–35)
Alkaline Phosphatase: 90 (ref 25–125)
Bilirubin, Total: 0.4

## 2020-11-09 LAB — CBC: RBC: 4.88 (ref 3.87–5.11)

## 2020-11-09 LAB — COMPREHENSIVE METABOLIC PANEL
Albumin: 3.9 (ref 3.5–5.0)
Calcium: 10.2 (ref 8.7–10.7)

## 2020-11-09 NOTE — Progress Notes (Unsigned)
Per intake coordinator '@[employer]'$  MCR [phone]; no PA required for CPT 81270 JAK 2 REF#[id number] 11/09/2020

## 2020-11-14 DIAGNOSIS — D751 Secondary polycythemia: Secondary | ICD-10-CM | POA: Insufficient documentation

## 2020-11-14 HISTORY — DX: Secondary polycythemia: D75.1

## 2020-11-15 LAB — JAK2 GENOTYPR

## 2020-12-31 DIAGNOSIS — H2513 Age-related nuclear cataract, bilateral: Secondary | ICD-10-CM | POA: Diagnosis not present

## 2021-01-07 DIAGNOSIS — R300 Dysuria: Secondary | ICD-10-CM | POA: Diagnosis not present

## 2021-01-11 DIAGNOSIS — E559 Vitamin D deficiency, unspecified: Secondary | ICD-10-CM | POA: Diagnosis not present

## 2021-01-11 DIAGNOSIS — M059 Rheumatoid arthritis with rheumatoid factor, unspecified: Secondary | ICD-10-CM | POA: Diagnosis not present

## 2021-01-11 DIAGNOSIS — Z1331 Encounter for screening for depression: Secondary | ICD-10-CM | POA: Diagnosis not present

## 2021-01-11 DIAGNOSIS — Z0001 Encounter for general adult medical examination with abnormal findings: Secondary | ICD-10-CM | POA: Diagnosis not present

## 2021-01-21 DIAGNOSIS — I6523 Occlusion and stenosis of bilateral carotid arteries: Secondary | ICD-10-CM | POA: Diagnosis not present

## 2021-01-21 DIAGNOSIS — R42 Dizziness and giddiness: Secondary | ICD-10-CM | POA: Diagnosis not present

## 2021-01-21 DIAGNOSIS — I7090 Unspecified atherosclerosis: Secondary | ICD-10-CM | POA: Diagnosis not present

## 2021-01-21 DIAGNOSIS — H539 Unspecified visual disturbance: Secondary | ICD-10-CM | POA: Diagnosis not present

## 2021-01-28 DIAGNOSIS — N3000 Acute cystitis without hematuria: Secondary | ICD-10-CM | POA: Diagnosis not present

## 2021-01-28 DIAGNOSIS — R300 Dysuria: Secondary | ICD-10-CM | POA: Diagnosis not present

## 2021-02-13 DIAGNOSIS — R350 Frequency of micturition: Secondary | ICD-10-CM | POA: Diagnosis not present

## 2021-03-27 DIAGNOSIS — Z87440 Personal history of urinary (tract) infections: Secondary | ICD-10-CM

## 2021-03-27 DIAGNOSIS — N952 Postmenopausal atrophic vaginitis: Secondary | ICD-10-CM | POA: Diagnosis not present

## 2021-03-27 HISTORY — DX: Personal history of urinary (tract) infections: Z87.440

## 2021-04-09 NOTE — Progress Notes (Signed)
Office Visit Note  Patient: Joy Fernandez             Date of Birth: 1945-05-12           MRN: [id number]             PCP: Ernestene Kiel, MD Referring: Ernestene Kiel, MD Visit Date: 04/23/2021 Occupation: @GUAROCC @  Subjective Joint stiffness  History of Present Illness: Joy Fernandez is a 76 y.o. female with history of rheumatoid arthritis and osteoarthritis.  She has been off methotrexate for 3 years now.  She has not noticed any joint swelling.  She continues to have some stiffness in her hands and her feet.  Activities of Daily Living:  Patient reports morning stiffness for 1-2 minutes.   Patient Denies nocturnal pain.  Difficulty dressing/grooming: Denies Difficulty climbing stairs: Denies Difficulty getting out of chair: Denies Difficulty using hands for taps, buttons, cutlery, and/or writing: Denies  Review of Systems  Constitutional:  Positive for fatigue. Negative for night sweats, weight gain and weight loss.  HENT: Negative.  Negative for mouth sores, trouble swallowing, trouble swallowing, mouth dryness and nose dryness.   Eyes:  Positive for dryness. Negative for pain, redness and visual disturbance.  Respiratory: Negative.  Negative for cough, shortness of breath and difficulty breathing.   Cardiovascular: Negative.  Negative for chest pain, palpitations, hypertension, irregular heartbeat and swelling in legs/feet.  Gastrointestinal:  Negative for blood in stool, constipation and diarrhea.  Endocrine: Positive for cold intolerance. Negative for increased urination.       Hands and feet are cold   Genitourinary: Negative.  Negative for vaginal dryness.  Musculoskeletal:  Positive for morning stiffness. Negative for joint pain, joint pain, joint swelling, myalgias, muscle weakness, muscle tenderness and myalgias.  Skin: Negative.  Negative for color change, rash, hair loss, skin tightness, ulcers and sensitivity to sunlight.  Allergic/Immunologic: Negative.   Negative for susceptible to infections.  Neurological:  Positive for dizziness and light-headedness. Negative for headaches, memory loss, night sweats and weakness.       Has had trouble with Vertigo   Hematological:  Positive for bruising/bleeding tendency. Negative for swollen glands.  Psychiatric/Behavioral: Negative.  Negative for depressed mood and sleep disturbance. The patient is not nervous/anxious.    PMFS History:  Patient Active Problem List   Diagnosis Date Noted   Family history of skin cancer 04/23/2021   History of malignant neoplasm of skin 04/23/2021   History of recurrent UTIs 03/27/2021   Polycythemia, secondary 11/14/2020   History of depression 06/06/2016   History of gastroesophageal reflux (GERD) 06/06/2016   Rheumatoid arthritis of multiple sites with negative rheumatoid factor ([place]) 05/30/2016   Primary osteoarthritis of both feet 05/30/2016   Primary osteoarthritis of both hands 05/30/2016   High risk medication use 05/30/2016   Age-related osteoporosis without current pathological fracture 05/30/2016   Lipoma of abdominal wall 08/01/2015   Idiopathic thrombocytopenic purpura ([place]) 02/24/2011    Past Medical History:  Diagnosis Date   Arthritis    Depression    Insomnia    ITP (idiopathic thrombocytopenic purpura) 02/24/2011   Osteoporosis    Rheumatoid arthritis ([place])     Family History  Problem Relation Age of Onset   Hypertension Mother    Heart disease Father    Hypertension Brother    Depression Daughter    Depression Son    Past Surgical History:  Procedure Laterality Date   ABDOMINAL HYSTERECTOMY     BREAST SURGERY Left  lumpectomy    CHOLECYSTECTOMY     MOHS SURGERY     x4   Social History   Social History Narrative   Not on file   Immunization History  Administered Date(s) Administered   Moderna Sars-Covid-2 Vaccination 05/02/2019     Objective: Vital Signs: BP 133/75    Pulse 67    Ht 5\' 4"  (1.626 m)    Wt 135 lb (61.2  kg)    BMI 23.17 kg/m    Physical Exam Vitals and nursing note reviewed.  Constitutional:      Appearance: She is well-developed.  HENT:     Head: Normocephalic and atraumatic.  Eyes:     Conjunctiva/sclera: Conjunctivae normal.  Cardiovascular:     Rate and Rhythm: Normal rate and regular rhythm.     Heart sounds: Normal heart sounds.  Pulmonary:     Effort: Pulmonary effort is normal.     Breath sounds: Normal breath sounds.  Abdominal:     General: Bowel sounds are normal.     Palpations: Abdomen is soft.  Musculoskeletal:     Cervical back: Normal range of motion.  Lymphadenopathy:     Cervical: No cervical adenopathy.  Skin:    General: Skin is warm and dry.     Capillary Refill: Capillary refill takes less than 2 seconds.     Comments: Senile purpura was noted on bilateral forearms.  Neurological:     Mental Status: She is alert and oriented to person, place, and time.  Psychiatric:        Behavior: Behavior normal.     Musculoskeletal Exam: On was in good range of motion.  Shoulder joints, elbow joints, wrist joints, MCPs PIPs and DIPs with good range of motion with no synovitis.  She had bilateral PIP and DIP thickening.  Hip joints and knee joints in good range of motion.  She had no tenderness over ankles or MTPs.  CDAI Exam: CDAI Score: -- Patient Global: --; Provider Global: -- Swollen: --; Tender: -- Joint Exam 04/23/2021   No joint exam has been documented for this visit   There is currently no information documented on the homunculus. Go to the Rheumatology activity and complete the homunculus joint exam.  Investigation: No additional findings.  Imaging: No results found.  Recent Labs: Lab Results  Component Value Date   WBC 6.9 11/09/2020   HGB 15.5 11/09/2020   PLT 145 (A) 11/09/2020   NA 139 11/09/2020   K 3.8 11/09/2020   CL 106 11/09/2020   CO2 23 (A) 11/09/2020   GLUCOSE 76 03/05/2020   BUN 15 11/09/2020   CREATININE 0.8 11/09/2020    BILITOT 0.4 03/05/2020   ALKPHOS 90 11/09/2020   AST 31 11/09/2020   ALT 23 11/09/2020   PROT 6.7 03/05/2020   ALBUMIN 3.9 11/09/2020   CALCIUM 10.2 11/09/2020   GFRAA 79 03/05/2020    Speciality Comments: No specialty comments available.  Procedures:  No procedures performed Allergies: Other and Codeine   Assessment / Plan:     Visit Diagnoses: Rheumatoid arthritis of multiple sites with negative rheumatoid factor (HCC)-patient had no synovitis on examination.  She had been off methotrexate since March 2020.  She continues to have some stiffness in her joints due to underlying osteoarthritis.  She was advised to contact us in case she develops any increased joint swelling.  High risk medication use - Discontinued MTX (6 tabs/wk) in March 2020-Concern for covid/clinically doing well.  Primary osteoarthritis of both  hands-she had bilateral PIP and DIP thickening.  Joint protection muscle strengthening was discussed.  Primary osteoarthritis of both feet-she denies any discomfort in her feet currently.  Age-related osteoporosis without current pathological fracture - DEXA 01/30/20 T-score: -2.6, BMD: 0.559 left femoral neck. discontinued Boniva after her recent DEXA on 01/18/18.  Diet rich in calcium with vitamin D and resistive exercises were discussed.  Repeat DEXA scan in December 2023.  History of depression  Idiopathic thrombocytopenic purpura (HCC)  History of gastroesophageal reflux (GERD)  Orders: No orders of the defined types were placed in this encounter.  No orders of the defined types were placed in this encounter.    Follow-Up Instructions: Return in about 5 months (around 09/21/2021) for Rheumatoid arthritis, Osteoarthritis.   Bo Merino, MD  Note - This record has been created using Editor, commissioning.  Chart creation errors have been sought, but may not always  have been located. Such creation errors do not reflect on  the standard of medical care.

## 2021-04-22 DIAGNOSIS — R42 Dizziness and giddiness: Secondary | ICD-10-CM | POA: Diagnosis not present

## 2021-04-23 ENCOUNTER — Encounter: Payer: Self-pay | Admitting: Rheumatology

## 2021-04-23 ENCOUNTER — Other Ambulatory Visit: Payer: Self-pay

## 2021-04-23 ENCOUNTER — Ambulatory Visit: Payer: Medicare Other | Admitting: Rheumatology

## 2021-04-23 VITALS — BP 133/75 | HR 67 | Ht 64.0 in | Wt 135.0 lb

## 2021-04-23 DIAGNOSIS — Z8719 Personal history of other diseases of the digestive system: Secondary | ICD-10-CM

## 2021-04-23 DIAGNOSIS — Z808 Family history of malignant neoplasm of other organs or systems: Secondary | ICD-10-CM

## 2021-04-23 DIAGNOSIS — M19041 Primary osteoarthritis, right hand: Secondary | ICD-10-CM | POA: Diagnosis not present

## 2021-04-23 DIAGNOSIS — M19071 Primary osteoarthritis, right ankle and foot: Secondary | ICD-10-CM | POA: Diagnosis not present

## 2021-04-23 DIAGNOSIS — Z8659 Personal history of other mental and behavioral disorders: Secondary | ICD-10-CM

## 2021-04-23 DIAGNOSIS — U071 COVID-19: Secondary | ICD-10-CM

## 2021-04-23 DIAGNOSIS — Z85828 Personal history of other malignant neoplasm of skin: Secondary | ICD-10-CM | POA: Insufficient documentation

## 2021-04-23 DIAGNOSIS — Z79899 Other long term (current) drug therapy: Secondary | ICD-10-CM | POA: Diagnosis not present

## 2021-04-23 DIAGNOSIS — D693 Immune thrombocytopenic purpura: Secondary | ICD-10-CM

## 2021-04-23 DIAGNOSIS — M810 Age-related osteoporosis without current pathological fracture: Secondary | ICD-10-CM

## 2021-04-23 DIAGNOSIS — M19072 Primary osteoarthritis, left ankle and foot: Secondary | ICD-10-CM

## 2021-04-23 DIAGNOSIS — M0609 Rheumatoid arthritis without rheumatoid factor, multiple sites: Secondary | ICD-10-CM

## 2021-04-23 DIAGNOSIS — M19042 Primary osteoarthritis, left hand: Secondary | ICD-10-CM

## 2021-04-23 HISTORY — DX: Family history of malignant neoplasm of other organs or systems: Z80.8

## 2021-04-23 HISTORY — DX: Personal history of other malignant neoplasm of skin: Z85.828

## 2021-04-23 NOTE — Addendum Note (Signed)
Addended by: Earnestine Mealing on: 04/23/2021 03:11 PM   Modules accepted: Orders

## 2021-04-30 DIAGNOSIS — Z8739 Personal history of other diseases of the musculoskeletal system and connective tissue: Secondary | ICD-10-CM | POA: Diagnosis not present

## 2021-04-30 DIAGNOSIS — Z85828 Personal history of other malignant neoplasm of skin: Secondary | ICD-10-CM | POA: Diagnosis not present

## 2021-04-30 DIAGNOSIS — M810 Age-related osteoporosis without current pathological fracture: Secondary | ICD-10-CM | POA: Diagnosis not present

## 2021-04-30 DIAGNOSIS — D582 Other hemoglobinopathies: Secondary | ICD-10-CM | POA: Diagnosis not present

## 2021-04-30 DIAGNOSIS — R7989 Other specified abnormal findings of blood chemistry: Secondary | ICD-10-CM | POA: Diagnosis not present

## 2021-05-02 DIAGNOSIS — M810 Age-related osteoporosis without current pathological fracture: Secondary | ICD-10-CM | POA: Diagnosis not present

## 2021-05-03 DIAGNOSIS — Z6825 Body mass index (BMI) 25.0-25.9, adult: Secondary | ICD-10-CM | POA: Diagnosis not present

## 2021-05-03 DIAGNOSIS — D751 Secondary polycythemia: Secondary | ICD-10-CM | POA: Diagnosis not present

## 2021-05-03 DIAGNOSIS — E785 Hyperlipidemia, unspecified: Secondary | ICD-10-CM | POA: Diagnosis not present

## 2021-05-03 DIAGNOSIS — I6782 Cerebral ischemia: Secondary | ICD-10-CM | POA: Diagnosis not present

## 2021-05-24 DIAGNOSIS — Z23 Encounter for immunization: Secondary | ICD-10-CM | POA: Diagnosis not present

## 2021-05-24 DIAGNOSIS — Z85828 Personal history of other malignant neoplasm of skin: Secondary | ICD-10-CM | POA: Diagnosis not present

## 2021-05-24 DIAGNOSIS — D224 Melanocytic nevi of scalp and neck: Secondary | ICD-10-CM | POA: Diagnosis not present

## 2021-05-24 DIAGNOSIS — Z808 Family history of malignant neoplasm of other organs or systems: Secondary | ICD-10-CM | POA: Diagnosis not present

## 2021-05-28 DIAGNOSIS — H8110 Benign paroxysmal vertigo, unspecified ear: Secondary | ICD-10-CM | POA: Diagnosis not present

## 2021-05-28 DIAGNOSIS — M810 Age-related osteoporosis without current pathological fracture: Secondary | ICD-10-CM | POA: Diagnosis not present

## 2021-05-28 DIAGNOSIS — E210 Primary hyperparathyroidism: Secondary | ICD-10-CM | POA: Diagnosis not present

## 2021-05-29 ENCOUNTER — Other Ambulatory Visit: Payer: Self-pay | Admitting: Internal Medicine

## 2021-05-29 ENCOUNTER — Other Ambulatory Visit (HOSPITAL_COMMUNITY): Payer: Self-pay | Admitting: Internal Medicine

## 2021-05-29 DIAGNOSIS — E210 Primary hyperparathyroidism: Secondary | ICD-10-CM

## 2021-06-10 ENCOUNTER — Encounter (HOSPITAL_COMMUNITY)
Admission: RE | Admit: 2021-06-10 | Discharge: 2021-06-10 | Disposition: A | Discharge status: Home / Self Care | Payer: Medicare Other | Source: Ambulatory Visit | Attending: Internal Medicine | Admitting: Internal Medicine

## 2021-06-10 ENCOUNTER — Other Ambulatory Visit: Payer: Self-pay

## 2021-06-10 DIAGNOSIS — E210 Primary hyperparathyroidism: Secondary | ICD-10-CM | POA: Diagnosis not present

## 2021-06-10 MED ORDER — TECHNETIUM TC 99M SESTAMIBI GENERIC - CARDIOLITE
26.0000 | Freq: Once | INTRAVENOUS | Status: AC | PRN
  Administered 2021-06-10: 26 via INTRAVENOUS

## 2021-06-19 DIAGNOSIS — E785 Hyperlipidemia, unspecified: Secondary | ICD-10-CM | POA: Diagnosis not present

## 2021-06-21 DIAGNOSIS — G4700 Insomnia, unspecified: Secondary | ICD-10-CM | POA: Diagnosis not present

## 2021-06-21 DIAGNOSIS — E785 Hyperlipidemia, unspecified: Secondary | ICD-10-CM | POA: Diagnosis not present

## 2021-06-21 DIAGNOSIS — K219 Gastro-esophageal reflux disease without esophagitis: Secondary | ICD-10-CM | POA: Diagnosis not present

## 2021-06-21 DIAGNOSIS — R42 Dizziness and giddiness: Secondary | ICD-10-CM | POA: Diagnosis not present

## 2021-06-26 DIAGNOSIS — R42 Dizziness and giddiness: Secondary | ICD-10-CM | POA: Diagnosis not present

## 2021-07-02 DIAGNOSIS — S93492A Sprain of other ligament of left ankle, initial encounter: Secondary | ICD-10-CM | POA: Diagnosis not present

## 2021-07-03 ENCOUNTER — Ambulatory Visit: Payer: Self-pay | Admitting: Surgery

## 2021-07-03 DIAGNOSIS — E210 Primary hyperparathyroidism: Secondary | ICD-10-CM | POA: Diagnosis not present

## 2021-07-05 DIAGNOSIS — M799 Soft tissue disorder, unspecified: Secondary | ICD-10-CM | POA: Diagnosis not present

## 2021-07-05 DIAGNOSIS — S93492A Sprain of other ligament of left ankle, initial encounter: Secondary | ICD-10-CM | POA: Diagnosis not present

## 2021-07-08 ENCOUNTER — Other Ambulatory Visit: Payer: Self-pay | Admitting: Surgery

## 2021-07-08 DIAGNOSIS — E210 Primary hyperparathyroidism: Secondary | ICD-10-CM

## 2021-08-02 DIAGNOSIS — Z79899 Other long term (current) drug therapy: Secondary | ICD-10-CM | POA: Diagnosis not present

## 2021-08-02 DIAGNOSIS — E785 Hyperlipidemia, unspecified: Secondary | ICD-10-CM | POA: Diagnosis not present

## 2021-08-08 NOTE — Progress Notes (Addendum)
Anesthesia Review:  PCP: Dr Ernestene Kiel  Cardiologist : none  Chest x-ray :08/12/21  EKG :08/12/21  Echo : Stress test: Cardiac Cath :  Activity level: can do a flight of stairs without difficulty  Sleep Study/ CPAP : none  Fasting Blood Sugar :      / Checks Blood Sugar -- times a day:   Blood Thinner/ Instructions /Last Dose: ASA / Instructions/ Last Dose :   PT reports at preop on 08/12/21 of son had surgery when he was young and woke up and could not move per pt.  PT states it was a chemical he could not break down.  Son has had surgeries since with no problems   Pt reports at preop never having any problems with any surgery.  PT reports husband was tested and he is positive for the same thing son has but has never had any problems with surgeries.  PT states she was never tested.  PT reports she does not know what the name of the chemical is and she will call me if she finds out.   PT LVMM on 08/27/21 and stated that the drug that pt's son had reaction to was Anectine? Marland Kitchen

## 2021-08-08 NOTE — Progress Notes (Signed)
DUE TO COVID-19 ONLY  2 VISITOR IS ALLOWED TO COME WITH YOU AND STAY IN THE WAITING ROOM ONLY DURING PRE OP AND PROCEDURE DAY OF SURGERY.   4 VISITOR  MAY VISIT WITH YOU AFTER SURGERY IN YOUR PRIVATE ROOM DURING VISITING HOURS ONLY! ?YOU MAY HAVE ONE PERSON SPEND THE NITE WITH YOU IN YOUR ROOM AFTER SURGERY.   ? ? ? ? Your procedure is scheduled on:  ?             08/29/2021  ? Report to [facility] Main  Entrance ? ? Report to admitting at    0615            AM ?DO NOT BRING INSURANCE CARD, PICTURE ID OR WALLET DAY OF SURGERY.  ?  ? ? Call this number if you have problems the morning of surgery [phone]  ? ? REMEMBER: NO  SOLID FOODS , CANDY, GUM OR MINTS AFTER MIDNITE THE NITE BEFORE SURGERY .       Marland Kitchen CLEAR LIQUIDS UNTIL      0530 am           DAY OF SURGERY.      PLEASE FINISH ENSURE DRINK PER SURGEON ORDER  WHICH NEEDS TO BE COMPLETED AT     0530am     MORNING OF SURGERY.   ? ? ? ? ?CLEAR LIQUID DIET ? ? ?Foods Allowed      ?WATER ?BLACK COFFEE ( SUGAR OK, NO MILK, CREAM OR CREAMER) REGULAR AND DECAF  ?TEA ( SUGAR OK NO MILK, CREAM, OR CREAMER) REGULAR AND DECAF  ?PLAIN JELLO ( NO RED)  ?FRUIT ICES ( NO RED, NO FRUIT PULP)  ?POPSICLES ( NO RED)  ?JUICE- APPLE, WHITE GRAPE AND WHITE CRANBERRY  ?SPORT DRINK LIKE GATORADE ( NO RED)  ?CLEAR BROTH ( VEGETABLE , CHICKEN OR BEEF)                                                               ? ?    ? ?BRUSH YOUR TEETH MORNING OF SURGERY AND RINSE YOUR MOUTH OUT, NO CHEWING GUM CANDY OR MINTS. ?  ? ? Take these medicines the morning of surgery with A SIP OF WATER:  protonix if needed,  ? ? ?DO NOT TAKE ANY DIABETIC MEDICATIONS DAY OF YOUR SURGERY ?                  ?            You may not have any metal on your body including hair pins and  ?            piercings  Do not wear jewelry, make-up, lotions, powders or perfumes, deodorant ?            Do not wear nail polish on your fingernails.   ?           IF YOU ARE A FEMALE AND WANT TO SHAVE UNDER ARMS OR LEGS  PRIOR TO SURGERY YOU MUST DO SO AT LEAST 48 HOURS PRIOR TO SURGERY.  ?            Men may shave face and neck. ? ? Do not bring valuables to the hospital. [facility] NOT ?  RESPONSIBLE   FOR VALUABLES. ? Contacts, dentures or bridgework may not be worn into surgery. ? Leave suitcase in the car. After surgery it may be brought to your room. ? ?  ? Patients discharged the day of surgery will not be allowed to drive home. IF YOU ARE HAVING SURGERY AND GOING HOME THE SAME DAY, YOU MUST HAVE AN ADULT TO DRIVE YOU HOME AND BE WITH YOU FOR 24 HOURS. YOU MAY GO HOME BY TAXI OR UBER OR ORTHERWISE, BUT AN ADULT MUST ACCOMPANY YOU HOME AND STAY WITH YOU FOR 24 HOURS. ?  ? ?            Please read over the following fact sheets you were given: ?_____________________________________________________________________ ? ?[place] - Preparing for Surgery ?Before surgery, you can play an important role.  Because skin is not sterile, your skin needs to be as free of germs as possible.  You can reduce the number of germs on your skin by washing with CHG (chlorahexidine gluconate) soap before surgery.  CHG is an antiseptic cleaner which kills germs and bonds with the skin to continue killing germs even after washing. ?Please DO NOT use if you have an allergy to CHG or antibacterial soaps.  If your skin becomes reddened/irritated stop using the CHG and inform your nurse when you arrive at Short Stay. ?Do not shave (including legs and underarms) for at least 48 hours prior to the first CHG shower.  You may shave your face/neck. ?Please follow these instructions carefully: ? 1.  Shower with CHG Soap the night before surgery and the  morning of Surgery. ? 2.  If you choose to wash your hair, wash your hair first as usual with your  normal  shampoo. ? 3.  After you shampoo, rinse your hair and body thoroughly to remove the  shampoo.                           4.  Use CHG as you would any other liquid soap.  You can apply chg  directly  to the skin and wash  ?                     Gently with a scrungie or clean washcloth. ? 5.  Apply the CHG Soap to your body ONLY FROM THE NECK DOWN.   Do not use on face/ open      ?                     Wound or open sores. Avoid contact with eyes, ears mouth and genitals (private parts).  ?                     Production manager,  Genitals (private parts) with your normal soap. ?            6.  Wash thoroughly, paying special attention to the area where your surgery  will be performed. ? 7.  Thoroughly rinse your body with warm water from the neck down. ? 8.  DO NOT shower/wash with your normal soap after using and rinsing off  the CHG Soap. ?               9.  Pat yourself dry with a clean towel. ?           10.  Wear clean pajamas. ?  11.  Place clean sheets on your bed the night of your first shower and do not  sleep with pets. ?Day of Surgery : ?Do not apply any lotions/deodorants the morning of surgery.  Please wear clean clothes to the hospital/surgery center. ? ?FAILURE TO FOLLOW THESE INSTRUCTIONS MAY RESULT IN THE CANCELLATION OF YOUR SURGERY ?PATIENT SIGNATURE_________________________________ ? ?NURSE SIGNATURE__________________________________ ? ?________________________________________________________________________  ? ? ?           ?

## 2021-08-12 ENCOUNTER — Encounter (HOSPITAL_COMMUNITY): Payer: Self-pay | Admitting: Surgery

## 2021-08-12 ENCOUNTER — Ambulatory Visit
Admission: RE | Admit: 2021-08-12 | Discharge: 2021-08-12 | Disposition: A | Discharge status: Home / Self Care | Payer: Medicare Other | Source: Ambulatory Visit | Attending: Surgery | Admitting: Surgery

## 2021-08-12 ENCOUNTER — Ambulatory Visit (HOSPITAL_COMMUNITY)
Admission: RE | Admit: 2021-08-12 | Discharge: 2021-08-12 | Disposition: A | Discharge status: Home / Self Care | Payer: Medicare Other | Source: Ambulatory Visit | Attending: Anesthesiology | Admitting: Anesthesiology

## 2021-08-12 ENCOUNTER — Encounter (HOSPITAL_COMMUNITY)
Admission: RE | Admit: 2021-08-12 | Discharge: 2021-08-12 | Disposition: A | Discharge status: Home / Self Care | Payer: Medicare Other | Source: Ambulatory Visit | Attending: Surgery | Admitting: Surgery

## 2021-08-12 ENCOUNTER — Other Ambulatory Visit: Payer: Self-pay

## 2021-08-12 ENCOUNTER — Encounter (HOSPITAL_COMMUNITY): Payer: Self-pay

## 2021-08-12 VITALS — BP 137/66 | HR 61 | Temp 98.0°F | Resp 16 | Ht 63.0 in | Wt 136.0 lb

## 2021-08-12 DIAGNOSIS — Z01818 Encounter for other preprocedural examination: Secondary | ICD-10-CM | POA: Insufficient documentation

## 2021-08-12 DIAGNOSIS — E210 Primary hyperparathyroidism: Secondary | ICD-10-CM | POA: Diagnosis not present

## 2021-08-12 DIAGNOSIS — E213 Hyperparathyroidism, unspecified: Secondary | ICD-10-CM | POA: Diagnosis not present

## 2021-08-12 HISTORY — DX: Gastro-esophageal reflux disease without esophagitis: K21.9

## 2021-08-12 HISTORY — DX: Cerebral ischemia: I67.82

## 2021-08-12 HISTORY — DX: Anxiety disorder, unspecified: F41.9

## 2021-08-12 HISTORY — DX: Disease of blood and blood-forming organs, unspecified: D75.9

## 2021-08-12 HISTORY — DX: Malignant (primary) neoplasm, unspecified: C80.1

## 2021-08-12 HISTORY — DX: Family history of other specified conditions: Z84.89

## 2021-08-12 HISTORY — DX: Nausea with vomiting, unspecified: R11.2

## 2021-08-12 HISTORY — DX: Other specified postprocedural states: Z98.890

## 2021-08-12 HISTORY — DX: Thrombocytopenia, unspecified: D69.6

## 2021-08-12 HISTORY — DX: Other complications of anesthesia, initial encounter: T88.59XA

## 2021-08-12 LAB — CBC
HCT: 46.1 % — ABNORMAL HIGH (ref 36.0–46.0)
Hemoglobin: 15.7 g/dL — ABNORMAL HIGH (ref 12.0–15.0)
MCH: 33.1 pg (ref 26.0–34.0)
MCHC: 34.1 g/dL (ref 30.0–36.0)
MCV: 97.3 fL (ref 80.0–100.0)
Platelets: 167 10*3/uL (ref 150–400)
RBC: 4.74 MIL/uL (ref 3.87–5.11)
RDW: 12.7 % (ref 11.5–15.5)
WBC: 7.8 10*3/uL (ref 4.0–10.5)
nRBC: 0 % (ref 0.0–0.2)

## 2021-08-12 LAB — BASIC METABOLIC PANEL
Anion gap: 6 (ref 5–15)
BUN: 16 mg/dL (ref 8–23)
CO2: 27 mmol/L (ref 22–32)
Calcium: 10.5 mg/dL — ABNORMAL HIGH (ref 8.9–10.3)
Chloride: 108 mmol/L (ref 98–111)
Creatinine, Ser: 0.75 mg/dL (ref 0.44–1.00)
GFR, Estimated: >60 mL/min (ref >60)
Glucose, Bld: 93 mg/dL (ref 70–99)
Potassium: 4.2 mmol/L (ref 3.5–5.1)
Sodium: 141 mmol/L (ref 135–145)

## 2021-08-14 NOTE — Progress Notes (Signed)
USN and sestamibi both confirm a right inferior parathyroid adenoma.  Will plan to perform minimally invasive parathyroidectomy as an outpatient procedure.  Surgery scheduled for 08/29/2021. ? ?Armandina Gemma, MD ?[facility] ?A [facility] practice ?Office: [phone] ? ?

## 2021-08-22 DIAGNOSIS — G8929 Other chronic pain: Secondary | ICD-10-CM | POA: Diagnosis not present

## 2021-08-22 DIAGNOSIS — M25511 Pain in right shoulder: Secondary | ICD-10-CM | POA: Diagnosis not present

## 2021-08-28 ENCOUNTER — Encounter (HOSPITAL_COMMUNITY): Payer: Self-pay | Admitting: Surgery

## 2021-08-28 DIAGNOSIS — E210 Primary hyperparathyroidism: Secondary | ICD-10-CM | POA: Diagnosis present

## 2021-08-28 HISTORY — DX: Primary hyperparathyroidism: E21.0

## 2021-08-28 NOTE — H&P (Signed)
REFERRING PHYSICIAN: Prochnau, Druscilla Brownie, MD  PROVIDER: Shuronda Santino Charlotta Newton, MD    Chief Complaint: New Consultation (Primary hyperparathyroidism)   History of Present Illness:  Patient is referred by Dr. Delrae Rend for surgical evaluation and management of primary hyperparathyroidism. Patient had been noted by her primary care physician to have elevated serum calcium levels. Patient has a history of a bone density scan showing osteopenia. She denies nephrolithiasis. She has had a recent twisted ankle but no recent fractures. Patient has had no prior head or neck surgery. There is no family history of parathyroid disease. Calcium level has measured as high as 11.0. Intact PTH level is unsuppressed at 40. Patient underwent a nuclear medicine parathyroid scan on June 10, 2021. This localized a right inferior parathyroid adenoma. Patient now presents to discuss surgery for management of primary hyperparathyroidism. Her daughter participated in today's interview by telephone.  Review of Systems: A complete review of systems was obtained from the patient. I have reviewed this information and discussed as appropriate with the patient. See HPI as well for other ROS.  Review of Systems  Constitutional: Positive for malaise/fatigue.  HENT: Negative.  Eyes: Negative.  Respiratory: Negative.  Cardiovascular: Negative.  Gastrointestinal: Negative.  Genitourinary: Positive for frequency.  Musculoskeletal: Negative.  Skin: Negative.  Neurological: Negative.  Endo/Heme/Allergies: Negative.  Psychiatric/Behavioral: Negative.    Medical History: Past Medical History:  Diagnosis Date   Arthritis   GERD (gastroesophageal reflux disease)   History of cancer   Hyperlipidemia   Hypertension   Patient Active Problem List  Diagnosis   Primary hyperparathyroidism (CMS-HCC)   Past Surgical History:  Procedure Laterality Date   CHOLECYSTECTOMY   HYSTERECTOMY    Allergies  Allergen  Reactions   Other Hives and Swelling  Synthetic codeine,given in liquid form for cough. Synthetic codeine,given in liquid form for cough. Synthetic codeine,given in liquid form for cough. Synthetic codeine,given in liquid form for cough. Synthetic codeine,given in liquid form for cough.   Codeine Rash  Other reaction(s): Unknown   Current Outpatient Medications on File Prior to Visit  Medication Sig Dispense Refill   ALPRAZolam (XANAX) 0.25 MG tablet 1 tablet   aspirin 81 MG EC tablet Take by mouth   LYSINE ORAL Take by mouth   pantoprazole (PROTONIX) 40 MG DR tablet 1 tablet   rosuvastatin (CRESTOR) 5 MG tablet   zolpidem (AMBIEN) 10 mg tablet   No current facility-administered medications on file prior to visit.   Family History  Problem Relation Age of Onset   High blood pressure (Hypertension) Mother   Deep vein thrombosis (DVT or abnormal blood clot formation) Mother   Lung cancer Mother   Skin cancer Father   Hyperlipidemia (Elevated cholesterol) Father   Heart valve disease Father   High blood pressure (Hypertension) Brother    Social History   Tobacco Use  Smoking Status Never   Passive exposure: Never  Smokeless Tobacco Never    Social History   Socioeconomic History   Marital status: Married  Tobacco Use   Smoking status: Never  Passive exposure: Never   Smokeless tobacco: Never  Substance and Sexual Activity   Alcohol use: Never   Drug use: Never   Objective:   Vitals:  BP: 110/70  Pulse: 75  Temp: 36.4 C (97.6 F)  SpO2: 98%  Weight: 63.3 kg (139 lb 9.6 oz)  Height: 162.6 cm ('5\' 4"'$ )   Body mass index is 23.96 kg/m.  Physical Exam   GENERAL  APPEARANCE Development: normal Nutritional status: normal Gross deformities: none  SKIN Rash, lesions, ulcers: none Induration, erythema: none Nodules: none palpable  EYES Conjunctiva and lids: normal Pupils: equal and reactive Iris: normal bilaterally  EARS, NOSE, MOUTH,  THROAT External ears: no lesion or deformity External nose: no lesion or deformity Hearing: grossly normal  NECK Symmetric: yes Trachea: midline Thyroid: no palpable nodules in the thyroid bed  CHEST Respiratory effort: normal Retraction or accessory muscle use: no Breath sounds: normal bilaterally Rales, rhonchi, wheeze: none  CARDIOVASCULAR Auscultation: regular rhythm, normal rate Murmurs: none Pulses: radial pulse 2+ palpable Lower extremity edema: none  MUSCULOSKELETAL Station and gait: normal Digits and nails: no clubbing or cyanosis Muscle strength: grossly normal all extremities Range of motion: grossly normal all extremities Deformity: Swelling left ankle with ecchymosis  LYMPHATIC Cervical: none palpable Supraclavicular: none palpable  PSYCHIATRIC Oriented to person, place, and time: yes Mood and affect: normal for situation Judgment and insight: appropriate for situation   Assessment and Plan:   Primary hyperparathyroidism (CMS-HCC)  Patient is referred by Dr. Dagmar Hait for surgical evaluation and management of suspected primary hyperparathyroidism.  Patient provided with a copy of "Parathyroid Surgery: Treatment for Your Parathyroid Gland Problem", published by [employer], 12 pages. Book reviewed and explained to patient during visit today.  Patient has biochemical evidence of primary hyperparathyroidism. Nuclear medicine parathyroid scan confirmed a right inferior parathyroid adenoma. I would like to obtain an ultrasound examination of the neck in hopes of confirming the location of the adenoma and ruling out any other enlarged parathyroid glands. Also this will evaluate the thyroid for any suspicious nodules.  Today we discussed minimally invasive parathyroidectomy as an outpatient surgical procedure. The patient was provided with written literature on parathyroid surgery. The patient understands the proposed procedure and agrees to proceed with ultrasound  and preoperative testing.  The risks and benefits of the procedure have been discussed at length with the patient. The patient understands the proposed procedure, potential alternative treatments, and the course of recovery to be expected. All of the patient's questions have been answered at this time. The patient wishes to proceed with surgery.  Armandina Gemma, MD [facility] A [place] practice Office: [phone]

## 2021-08-28 NOTE — Anesthesia Preprocedure Evaluation (Signed)
Anesthesia Evaluation  Patient identified by MRN, date of birth, ID band Patient awake    Reviewed: Allergy & Precautions, NPO status , Patient's Chart, lab work & pertinent test results  History of Anesthesia Complications (+) PONV and history of anesthetic complications  Airway Mallampati: II  TM Distance: >3 FB Neck ROM: Full    Dental no notable dental hx. (+) Dental Advisory Given   Pulmonary neg pulmonary ROS,    Pulmonary exam normal        Cardiovascular negative cardio ROS Normal cardiovascular exam     Neuro/Psych PSYCHIATRIC DISORDERS Anxiety Depression negative neurological ROS     GI/Hepatic Neg liver ROS, GERD  Medicated,  Endo/Other  PRIMARY HYPERPARATHYROIDISM  Renal/GU negative Renal ROS     Musculoskeletal  (+) Arthritis , Rheumatoid disorders,    Abdominal   Peds  Hematology negative hematology ROS (+) ITP   Anesthesia Other Findings   Reproductive/Obstetrics                            Anesthesia Physical Anesthesia Plan  ASA: 2  Anesthesia Plan: General   Post-op Pain Management: Celebrex PO (pre-op)* and Tylenol PO (pre-op)*   Induction: Intravenous  PONV Risk Score and Plan: 4 or greater and Ondansetron, Dexamethasone, TIVA and Diphenhydramine  Airway Management Planned: Oral ETT  Additional Equipment:   Intra-op Plan:   Post-operative Plan: Extubation in OR  Informed Consent: I have reviewed the patients History and Physical, chart, labs and discussed the procedure including the risks, benefits and alternatives for the proposed anesthesia with the patient or authorized representative who has indicated his/her understanding and acceptance.     Dental advisory given  Plan Discussed with: Anesthesiologist and CRNA  Anesthesia Plan Comments:        Anesthesia Quick Evaluation

## 2021-08-29 ENCOUNTER — Encounter (HOSPITAL_COMMUNITY)
Admission: RE | Disposition: A | Discharge status: Home / Self Care | Payer: Self-pay | Source: Ambulatory Visit | Attending: Surgery

## 2021-08-29 ENCOUNTER — Ambulatory Visit (HOSPITAL_COMMUNITY)
Admission: RE | Admit: 2021-08-29 | Discharge: 2021-08-29 | Disposition: A | Discharge status: Home / Self Care | Payer: Medicare Other | Source: Ambulatory Visit | Attending: Surgery | Admitting: Surgery

## 2021-08-29 ENCOUNTER — Ambulatory Visit (HOSPITAL_COMMUNITY): Payer: Medicare Other | Admitting: Physician Assistant

## 2021-08-29 ENCOUNTER — Other Ambulatory Visit: Payer: Self-pay

## 2021-08-29 ENCOUNTER — Ambulatory Visit (HOSPITAL_BASED_OUTPATIENT_CLINIC_OR_DEPARTMENT_OTHER): Payer: Medicare Other | Admitting: Physician Assistant

## 2021-08-29 ENCOUNTER — Encounter (HOSPITAL_COMMUNITY): Payer: Self-pay | Admitting: Surgery

## 2021-08-29 DIAGNOSIS — E210 Primary hyperparathyroidism: Secondary | ICD-10-CM

## 2021-08-29 DIAGNOSIS — M8580 Other specified disorders of bone density and structure, unspecified site: Secondary | ICD-10-CM | POA: Insufficient documentation

## 2021-08-29 DIAGNOSIS — F32A Depression, unspecified: Secondary | ICD-10-CM | POA: Insufficient documentation

## 2021-08-29 DIAGNOSIS — I444 Left anterior fascicular block: Secondary | ICD-10-CM | POA: Diagnosis not present

## 2021-08-29 DIAGNOSIS — D693 Immune thrombocytopenic purpura: Secondary | ICD-10-CM | POA: Insufficient documentation

## 2021-08-29 DIAGNOSIS — K219 Gastro-esophageal reflux disease without esophagitis: Secondary | ICD-10-CM | POA: Diagnosis not present

## 2021-08-29 DIAGNOSIS — D351 Benign neoplasm of parathyroid gland: Secondary | ICD-10-CM | POA: Insufficient documentation

## 2021-08-29 DIAGNOSIS — F419 Anxiety disorder, unspecified: Secondary | ICD-10-CM | POA: Insufficient documentation

## 2021-08-29 DIAGNOSIS — Z01818 Encounter for other preprocedural examination: Secondary | ICD-10-CM

## 2021-08-29 DIAGNOSIS — M069 Rheumatoid arthritis, unspecified: Secondary | ICD-10-CM | POA: Insufficient documentation

## 2021-08-29 HISTORY — PX: PARATHYROIDECTOMY: SHX19

## 2021-08-29 SURGERY — PARATHYROIDECTOMY
Anesthesia: General | Laterality: Right

## 2021-08-29 MED ORDER — FENTANYL CITRATE PF 50 MCG/ML IJ SOSY
PREFILLED_SYRINGE | INTRAMUSCULAR | Status: AC
  Filled 2021-08-29: qty 1

## 2021-08-29 MED ORDER — ROCURONIUM BROMIDE 10 MG/ML (PF) SYRINGE
PREFILLED_SYRINGE | INTRAVENOUS | Status: DC | PRN
  Administered 2021-08-29: 70 mg via INTRAVENOUS

## 2021-08-29 MED ORDER — CEFAZOLIN SODIUM-DEXTROSE 2-4 GM/100ML-% IV SOLN
2.0000 g | INTRAVENOUS | Status: AC
  Administered 2021-08-29: 2 g via INTRAVENOUS
  Filled 2021-08-29: qty 100

## 2021-08-29 MED ORDER — BUPIVACAINE HCL (PF) 0.25 % IJ SOLN
INTRAMUSCULAR | Status: AC
  Filled 2021-08-29: qty 30

## 2021-08-29 MED ORDER — 0.9 % SODIUM CHLORIDE (POUR BTL) OPTIME
TOPICAL | Status: DC | PRN
  Administered 2021-08-29: 1000 mL

## 2021-08-29 MED ORDER — DEXAMETHASONE SODIUM PHOSPHATE 10 MG/ML IJ SOLN
INTRAMUSCULAR | Status: DC | PRN
  Administered 2021-08-29: 10 mg via INTRAVENOUS

## 2021-08-29 MED ORDER — AMISULPRIDE (ANTIEMETIC) 5 MG/2ML IV SOLN
10.0000 mg | Freq: Once | INTRAVENOUS | Status: AC | PRN
  Administered 2021-08-29: 10 mg via INTRAVENOUS

## 2021-08-29 MED ORDER — CHLORHEXIDINE GLUCONATE 0.12 % MT SOLN
15.0000 mL | Freq: Once | OROMUCOSAL | Status: AC
  Administered 2021-08-29: 15 mL via OROMUCOSAL

## 2021-08-29 MED ORDER — EPHEDRINE SULFATE-NACL 50-0.9 MG/10ML-% IV SOSY
PREFILLED_SYRINGE | INTRAVENOUS | Status: DC | PRN
  Administered 2021-08-29: 10 mg via INTRAVENOUS

## 2021-08-29 MED ORDER — ONDANSETRON HCL 4 MG/2ML IJ SOLN
INTRAMUSCULAR | Status: DC | PRN
  Administered 2021-08-29: 4 mg via INTRAVENOUS

## 2021-08-29 MED ORDER — FENTANYL CITRATE (PF) 250 MCG/5ML IJ SOLN
INTRAMUSCULAR | Status: AC
  Filled 2021-08-29: qty 5

## 2021-08-29 MED ORDER — SUGAMMADEX SODIUM 200 MG/2ML IV SOLN
INTRAVENOUS | Status: DC | PRN
  Administered 2021-08-29: 200 mg via INTRAVENOUS

## 2021-08-29 MED ORDER — TRAMADOL HCL 50 MG PO TABS: 50.0000 mg | ORAL_TABLET | Freq: Four times a day (QID) | ORAL | 0 refills | Status: DC | PRN

## 2021-08-29 MED ORDER — FENTANYL CITRATE PF 50 MCG/ML IJ SOSY
25.0000 ug | PREFILLED_SYRINGE | INTRAMUSCULAR | Status: DC | PRN
  Administered 2021-08-29: 50 ug via INTRAVENOUS

## 2021-08-29 MED ORDER — PHENYLEPHRINE 80 MCG/ML (10ML) SYRINGE FOR IV PUSH (FOR BLOOD PRESSURE SUPPORT)
PREFILLED_SYRINGE | INTRAVENOUS | Status: DC | PRN
  Administered 2021-08-29: 80 ug via INTRAVENOUS

## 2021-08-29 MED ORDER — CELECOXIB 200 MG PO CAPS
200.0000 mg | ORAL_CAPSULE | Freq: Once | ORAL | Status: AC
Start: 2021-08-29 — End: 2021-08-29
  Administered 2021-08-29: 200 mg via ORAL
  Filled 2021-08-29: qty 1

## 2021-08-29 MED ORDER — AMISULPRIDE (ANTIEMETIC) 5 MG/2ML IV SOLN
INTRAVENOUS | Status: AC
  Filled 2021-08-29: qty 4

## 2021-08-29 MED ORDER — PROPOFOL 10 MG/ML IV BOLUS
INTRAVENOUS | Status: AC
  Filled 2021-08-29: qty 20

## 2021-08-29 MED ORDER — FENTANYL CITRATE (PF) 100 MCG/2ML IJ SOLN
INTRAMUSCULAR | Status: DC | PRN
  Administered 2021-08-29: 100 ug via INTRAVENOUS

## 2021-08-29 MED ORDER — LACTATED RINGERS IV SOLN: INTRAVENOUS | Status: DC

## 2021-08-29 MED ORDER — ORAL CARE MOUTH RINSE: 15.0000 mL | Freq: Once | OROMUCOSAL | Status: AC

## 2021-08-29 MED ORDER — PROMETHAZINE HCL 25 MG/ML IJ SOLN
6.2500 mg | INTRAMUSCULAR | Status: DC | PRN
  Administered 2021-08-29: 6.25 mg via INTRAVENOUS

## 2021-08-29 MED ORDER — PROMETHAZINE HCL 25 MG/ML IJ SOLN
INTRAMUSCULAR | Status: AC
  Filled 2021-08-29: qty 1

## 2021-08-29 MED ORDER — LIDOCAINE 2% (20 MG/ML) 5 ML SYRINGE
INTRAMUSCULAR | Status: DC | PRN
  Administered 2021-08-29: 80 mg via INTRAVENOUS

## 2021-08-29 MED ORDER — ACETAMINOPHEN 500 MG PO TABS
1000.0000 mg | ORAL_TABLET | Freq: Once | ORAL | Status: AC
Start: 2021-08-29 — End: 2021-08-29
  Administered 2021-08-29: 1000 mg via ORAL
  Filled 2021-08-29: qty 2

## 2021-08-29 MED ORDER — PROPOFOL 10 MG/ML IV BOLUS
INTRAVENOUS | Status: DC | PRN
  Administered 2021-08-29: 120 mg via INTRAVENOUS

## 2021-08-29 MED ORDER — CHLORHEXIDINE GLUCONATE CLOTH 2 % EX PADS: 6.0000 | MEDICATED_PAD | Freq: Once | CUTANEOUS | Status: DC

## 2021-08-29 MED ORDER — BUPIVACAINE HCL 0.25 % IJ SOLN
INTRAMUSCULAR | Status: DC | PRN
  Administered 2021-08-29: 10 mL

## 2021-08-29 SURGICAL SUPPLY — 38 items
ADH SKN CLS APL DERMABOND .7 (GAUZE/BANDAGES/DRESSINGS) ×1
APL PRP STRL LF DISP 70% ISPRP (MISCELLANEOUS) ×1
ATTRACTOMAT 16X20 MAGNETIC DRP (DRAPES) ×3 IMPLANT
BAG COUNTER SPONGE SURGICOUNT (BAG) ×2 IMPLANT
BAG SPNG CNTER NS LX DISP (BAG)
BLADE SURG 15 STRL LF DISP TIS (BLADE) ×2 IMPLANT
BLADE SURG 15 STRL SS (BLADE) ×2
CHLORAPREP W/TINT 26 (MISCELLANEOUS) ×3 IMPLANT
CLIP TI MEDIUM 6 (CLIP) ×6 IMPLANT
CLIP TI WIDE RED SMALL 6 (CLIP) ×6 IMPLANT
COVER SURGICAL LIGHT HANDLE (MISCELLANEOUS) ×3 IMPLANT
DERMABOND ADVANCED (GAUZE/BANDAGES/DRESSINGS) ×1
DERMABOND ADVANCED .7 DNX12 (GAUZE/BANDAGES/DRESSINGS) ×2 IMPLANT
DRAPE LAPAROTOMY T 98X78 PEDS (DRAPES) ×3 IMPLANT
DRAPE UTILITY XL STRL (DRAPES) ×3 IMPLANT
ELECT REM PT RETURN 15FT ADLT (MISCELLANEOUS) ×3 IMPLANT
GAUZE 4X4 16PLY ~~LOC~~+RFID DBL (SPONGE) ×3 IMPLANT
GLOVE SURG ORTHO 8.0 STRL STRW (GLOVE) ×3 IMPLANT
GLOVE SURG SYN 7.5  E (GLOVE) ×6
GLOVE SURG SYN 7.5 E (GLOVE) ×3 IMPLANT
GLOVE SURG SYN 7.5 PF PI (GLOVE) ×6 IMPLANT
GOWN STRL REUS W/ TWL XL LVL3 (GOWN DISPOSABLE) ×6 IMPLANT
GOWN STRL REUS W/TWL XL LVL3 (GOWN DISPOSABLE) ×6
HEMOSTAT SURGICEL 2X4 FIBR (HEMOSTASIS) ×3 IMPLANT
ILLUMINATOR WAVEGUIDE N/F (MISCELLANEOUS) IMPLANT
KIT BASIN OR (CUSTOM PROCEDURE TRAY) ×3 IMPLANT
KIT TURNOVER KIT A (KITS) ×1 IMPLANT
NDL HYPO 25X1 1.5 SAFETY (NEEDLE) ×2 IMPLANT
NEEDLE HYPO 25X1 1.5 SAFETY (NEEDLE) ×2 IMPLANT
PACK BASIC VI WITH GOWN DISP (CUSTOM PROCEDURE TRAY) ×3 IMPLANT
PENCIL SMOKE EVACUATOR (MISCELLANEOUS) ×3 IMPLANT
SUT MNCRL AB 4-0 PS2 18 (SUTURE) ×3 IMPLANT
SUT VIC AB 3-0 SH 18 (SUTURE) ×3 IMPLANT
SYR BULB IRRIG 60ML STRL (SYRINGE) ×3 IMPLANT
SYR CONTROL 10ML LL (SYRINGE) ×3 IMPLANT
TOWEL OR 17X26 10 PK STRL BLUE (TOWEL DISPOSABLE) ×3 IMPLANT
TOWEL OR NON WOVEN STRL DISP B (DISPOSABLE) ×3 IMPLANT
TUBING CONNECTING 10 (TUBING) ×3 IMPLANT

## 2021-08-29 NOTE — Anesthesia Postprocedure Evaluation (Signed)
Anesthesia Post Note  Patient: Joy Fernandez  Procedure(s) Performed: RIGHT INFERIOR PARATHYROIDECTOMY (Right)     Patient location during evaluation: PACU Anesthesia Type: General Level of consciousness: sedated Pain management: pain level controlled Vital Signs Assessment: post-procedure vital signs reviewed and stable Respiratory status: spontaneous breathing and respiratory function stable Cardiovascular status: stable Postop Assessment: no apparent nausea or vomiting Anesthetic complications: no   No notable events documented.  Last Vitals:  Vitals:   08/29/21 1015 08/29/21 1025  BP: (!) 155/70 (!) 160/73  Pulse: (!) 56 61  Resp: 18 18  Temp: (!) 36.3 C 36.4 C  SpO2: 100% 97%    Last Pain:  Vitals:   08/29/21 1025  TempSrc: Oral  PainSc: 0-No pain                 Ski Polich DANIEL

## 2021-08-29 NOTE — Anesthesia Procedure Notes (Signed)
Procedure Name: Intubation Date/Time: 08/29/2021 8:43 AM Performed by: Gean Maidens, CRNA Pre-anesthesia Checklist: Patient identified, Emergency Drugs available, Suction available, Patient being monitored and Timeout performed Patient Re-evaluated:Patient Re-evaluated prior to induction Oxygen Delivery Method: Circle system utilized Preoxygenation: Pre-oxygenation with 100% oxygen Induction Type: IV induction Ventilation: Mask ventilation without difficulty Laryngoscope Size: Mac Grade View: Grade I Tube type: Oral Tube size: 7.0 mm Number of attempts: 1 Airway Equipment and Method: Stylet Placement Confirmation: ETT inserted through vocal cords under direct vision, positive ETCO2 and breath sounds checked- equal and bilateral Secured at: 21 cm Tube secured with: Tape Dental Injury: Teeth and Oropharynx as per pre-operative assessment

## 2021-08-29 NOTE — Interval H&P Note (Signed)
History and Physical Interval Note:  08/29/2021 8:08 AM  Joy Fernandez  has presented today for surgery, with the diagnosis of PRIMARY HYPERPARATHYROIDISM.  The various methods of treatment have been discussed with the patient and family. After consideration of risks, benefits and other options for treatment, the patient has consented to    Procedure(s): RIGHT INFERIOR PARATHYROIDECTOMY (Right) as a surgical intervention.    The patient's history has been reviewed, patient examined, no change in status, stable for surgery.  I have reviewed the patient's chart and labs.  Questions were answered to the patient's satisfaction.    Armandina Gemma, [facility] A [place] practice Office: [place]

## 2021-08-29 NOTE — Transfer of Care (Signed)
Immediate Anesthesia Transfer of Care Note  Patient: Joy Fernandez  Procedure(s) Performed: RIGHT INFERIOR PARATHYROIDECTOMY (Right)  Patient Location: PACU  Anesthesia Type:General  Level of Consciousness: awake, alert  and oriented  Airway & Oxygen Therapy: Patient Spontanous Breathing and Patient connected to face mask oxygen  Post-op Assessment: Report given to RN and Post -op Vital signs reviewed and stable  Post vital signs: Reviewed and stable  Last Vitals:  Vitals Value Taken Time  BP 169/88 08/29/21 0934  Temp    Pulse 81 08/29/21 0936  Resp 16 08/29/21 0936  SpO2 99 % 08/29/21 0936  Vitals shown include unvalidated device data.  Last Pain:  Vitals:   08/29/21 0648  TempSrc:   PainSc: 0-No pain      Patients Stated Pain Goal: 4 (84/69/62 9528)  Complications: No notable events documented.

## 2021-08-29 NOTE — Op Note (Signed)
OPERATIVE REPORT - PARATHYROIDECTOMY  Preoperative diagnosis: Primary hyperparathyroidism  Postop diagnosis: Same  Procedure: Right inferior minimally invasive parathyroidectomy  Surgeon:  Armandina Gemma, MD  Anesthesia: General endotracheal  Estimated blood loss: Minimal  Preparation: ChloraPrep  Indications: Patient is referred by Dr. Delrae Rend for surgical evaluation and management of primary hyperparathyroidism. Patient had been noted by her primary care physician to have elevated serum calcium levels. Patient has a history of a bone density scan showing osteopenia. She denies nephrolithiasis. She has had a recent twisted ankle but no recent fractures. Patient has had no prior head or neck surgery. There is no family history of parathyroid disease. Calcium level has measured as high as 11.0. Intact PTH level is unsuppressed at 40. Patient underwent a nuclear medicine parathyroid scan on June 10, 2021. This localized a right inferior parathyroid adenoma. Patient now presents to discuss surgery for management of primary hyperparathyroidism.  Procedure: The patient was prepared in the pre-operative holding area. The patient was brought to the operating room and placed in a supine position on the operating room table. Following administration of general anesthesia, the patient was positioned and then prepped and draped in the usual strict aseptic fashion. After ascertaining that an adequate level of anesthesia been achieved, a neck incision was made with a #15 blade. Dissection was carried through subcutaneous tissues and platysma. Hemostasis was obtained with the electrocautery. Skin flaps were developed circumferentially and a Weitlander retractor was placed for exposure.  Strap muscles were incised in the midline. Strap muscles were reflected laterally exposing the thyroid lobe. With gentle blunt dissection the thyroid lobe was mobilized.  Dissection was carried posteriorly and an enlarged  parathyroid gland was identified. It was gently mobilized. Vascular structures were divided between small ligaclips. Care was taken to avoid the recurrent laryngeal nerve. The parathyroid gland was completely excised. It was submitted to pathology where frozen section confirmed hypercellular parathyroid tissue consistent with adenoma.  Neck was irrigated with warm saline and good hemostasis was noted. Fibrillar was placed in the operative field. Strap muscles were approximated in the midline with interrupted 3-0 Vicryl sutures. Platysma was closed with interrupted 3-0 Vicryl sutures. Marcaine was infiltrated circumferentially. Skin was closed with a running 4-0 Monocryl subcuticular suture. Wound was washed and dried and Dermabond was applied. Patient was awakened from anesthesia and brought to the recovery room. The patient tolerated the procedure well.   Armandina Gemma, [facility] Office: [phone]

## 2021-08-30 ENCOUNTER — Encounter (HOSPITAL_COMMUNITY): Payer: Self-pay | Admitting: Surgery

## 2021-08-30 LAB — SURGICAL PATHOLOGY

## 2021-09-05 DIAGNOSIS — J988 Other specified respiratory disorders: Secondary | ICD-10-CM | POA: Diagnosis not present

## 2021-09-12 DIAGNOSIS — Z9889 Other specified postprocedural states: Secondary | ICD-10-CM

## 2021-09-12 DIAGNOSIS — E210 Primary hyperparathyroidism: Secondary | ICD-10-CM | POA: Diagnosis not present

## 2021-09-12 DIAGNOSIS — E892 Postprocedural hypoparathyroidism: Secondary | ICD-10-CM | POA: Diagnosis not present

## 2021-09-12 DIAGNOSIS — Z9089 Acquired absence of other organs: Secondary | ICD-10-CM

## 2021-09-12 HISTORY — DX: Acquired absence of other organs: Z90.89

## 2021-09-14 DIAGNOSIS — K219 Gastro-esophageal reflux disease without esophagitis: Secondary | ICD-10-CM | POA: Diagnosis not present

## 2021-09-14 DIAGNOSIS — R051 Acute cough: Secondary | ICD-10-CM | POA: Diagnosis not present

## 2021-09-14 DIAGNOSIS — R059 Cough, unspecified: Secondary | ICD-10-CM | POA: Diagnosis not present

## 2021-09-18 DIAGNOSIS — H40013 Open angle with borderline findings, low risk, bilateral: Secondary | ICD-10-CM | POA: Diagnosis not present

## 2021-10-07 DIAGNOSIS — Z1231 Encounter for screening mammogram for malignant neoplasm of breast: Secondary | ICD-10-CM | POA: Diagnosis not present

## 2021-10-21 DIAGNOSIS — H2513 Age-related nuclear cataract, bilateral: Secondary | ICD-10-CM | POA: Diagnosis not present

## 2021-11-15 DIAGNOSIS — D224 Melanocytic nevi of scalp and neck: Secondary | ICD-10-CM | POA: Diagnosis not present

## 2021-11-15 DIAGNOSIS — Z85828 Personal history of other malignant neoplasm of skin: Secondary | ICD-10-CM | POA: Diagnosis not present

## 2021-11-15 DIAGNOSIS — D2261 Melanocytic nevi of right upper limb, including shoulder: Secondary | ICD-10-CM | POA: Diagnosis not present

## 2021-11-15 DIAGNOSIS — Z808 Family history of malignant neoplasm of other organs or systems: Secondary | ICD-10-CM | POA: Diagnosis not present

## 2021-11-25 DIAGNOSIS — M810 Age-related osteoporosis without current pathological fracture: Secondary | ICD-10-CM | POA: Diagnosis not present

## 2021-11-25 DIAGNOSIS — Z8639 Personal history of other endocrine, nutritional and metabolic disease: Secondary | ICD-10-CM | POA: Diagnosis not present

## 2021-12-10 DIAGNOSIS — E785 Hyperlipidemia, unspecified: Secondary | ICD-10-CM | POA: Diagnosis not present

## 2021-12-10 DIAGNOSIS — E213 Hyperparathyroidism, unspecified: Secondary | ICD-10-CM | POA: Diagnosis not present

## 2021-12-10 DIAGNOSIS — Z79899 Other long term (current) drug therapy: Secondary | ICD-10-CM | POA: Diagnosis not present

## 2021-12-10 DIAGNOSIS — D582 Other hemoglobinopathies: Secondary | ICD-10-CM | POA: Diagnosis not present

## 2021-12-10 DIAGNOSIS — K219 Gastro-esophageal reflux disease without esophagitis: Secondary | ICD-10-CM | POA: Diagnosis not present

## 2022-01-07 DIAGNOSIS — H0288A Meibomian gland dysfunction right eye, upper and lower eyelids: Secondary | ICD-10-CM | POA: Diagnosis not present

## 2022-01-07 DIAGNOSIS — H0288B Meibomian gland dysfunction left eye, upper and lower eyelids: Secondary | ICD-10-CM | POA: Diagnosis not present

## 2022-01-13 DIAGNOSIS — Z0000 Encounter for general adult medical examination without abnormal findings: Secondary | ICD-10-CM | POA: Diagnosis not present

## 2022-01-13 DIAGNOSIS — Z1331 Encounter for screening for depression: Secondary | ICD-10-CM | POA: Diagnosis not present

## 2022-01-13 DIAGNOSIS — E785 Hyperlipidemia, unspecified: Secondary | ICD-10-CM | POA: Diagnosis not present

## 2022-01-13 DIAGNOSIS — G4700 Insomnia, unspecified: Secondary | ICD-10-CM | POA: Diagnosis not present

## 2022-02-10 DIAGNOSIS — L820 Inflamed seborrheic keratosis: Secondary | ICD-10-CM | POA: Diagnosis not present

## 2022-02-10 DIAGNOSIS — R202 Paresthesia of skin: Secondary | ICD-10-CM | POA: Diagnosis not present

## 2022-03-04 DIAGNOSIS — H0288B Meibomian gland dysfunction left eye, upper and lower eyelids: Secondary | ICD-10-CM | POA: Diagnosis not present

## 2022-03-04 DIAGNOSIS — H0288A Meibomian gland dysfunction right eye, upper and lower eyelids: Secondary | ICD-10-CM | POA: Diagnosis not present

## 2022-04-08 NOTE — Progress Notes (Signed)
Office Visit Note  Patient: Joy Fernandez             Date of Birth: 11-Nov-1945           MRN: [id number]             PCP: Ernestene Kiel, MD Referring: Ernestene Kiel, MD Visit Date: 04/22/2022 Occupation: '@GUAROCC'$ @  Subjective:  Joint stiffness  History of Present Illness: Joy Fernandez is a 76 y.o. female with history of rheumatoid arthritis, osteoarthritis and osteoporosis.  According to the patient she underwent parathyroidectomy in June 2023.  She has been followed by Dr. Buddy Duty.  She has not had a flare of rheumatoid arthritis since the last visit.  She has been off methotrexate since March 2020.  She has some stiffness in her hands and her feet due to osteoarthritis.  She has not noticed any joint swelling.  She has been off Boniva since October 2019.  She has been avoiding calcium and vitamin D due to hyperparathyroidism.  She has been active and walking on a regular basis.    Activities of Daily Living:  Patient reports morning stiffness for a few minutes.   Patient Denies nocturnal pain.  Difficulty dressing/grooming: Denies Difficulty climbing stairs: Denies Difficulty getting out of chair: Denies Difficulty using hands for taps, buttons, cutlery, and/or writing: Denies  Review of Systems  Constitutional:  Negative for fatigue.  HENT:  Negative for mouth sores and mouth dryness.   Eyes:  Positive for dryness.  Respiratory:  Negative for shortness of breath.   Cardiovascular:  Negative for chest pain and palpitations.  Gastrointestinal:  Negative for blood in stool, constipation and diarrhea.  Endocrine: Negative for increased urination.  Genitourinary:  Negative for involuntary urination.  Musculoskeletal:  Positive for morning stiffness. Negative for joint pain, gait problem, joint pain, joint swelling, myalgias, muscle weakness, muscle tenderness and myalgias.  Skin:  Negative for color change, rash, hair loss and sensitivity to sunlight.  Allergic/Immunologic:  Negative for susceptible to infections.  Neurological:  Positive for light-headedness. Negative for dizziness and headaches.  Hematological:  Negative for swollen glands.  Psychiatric/Behavioral:  Negative for depressed mood and sleep disturbance. The patient is not nervous/anxious.     PMFS History:  Patient Active Problem List   Diagnosis Date Noted   Hyperparathyroidism, primary ([place]) 08/28/2021   Family history of skin cancer 04/23/2021   History of malignant neoplasm of skin 04/23/2021   History of recurrent UTIs 03/27/2021   Polycythemia, secondary 11/14/2020   History of depression 06/06/2016   History of gastroesophageal reflux (GERD) 06/06/2016   Rheumatoid arthritis of multiple sites with negative rheumatoid factor ([place]) 05/30/2016   Primary osteoarthritis of both feet 05/30/2016   Primary osteoarthritis of both hands 05/30/2016   High risk medication use 05/30/2016   Age-related osteoporosis without current pathological fracture 05/30/2016   Lipoma of abdominal wall 08/01/2015   Idiopathic thrombocytopenic purpura ([place]) 02/24/2011    Past Medical History:  Diagnosis Date   Anxiety    Arthritis    Blood dyscrasia    low platelets - no longer followed by hematology   Cancer ([place])    hx of skin cancers   Complication of anesthesia    Depression    Family history of adverse reaction to anesthesia    son - when young had surgery and woke up and could not move could not break down the chemical and your husband tested positive for same condition son has had surgery since and  done ok husband has done okwith surgeries   GERD (gastroesophageal reflux disease)    Insomnia    Ischemic cerebrovascular disease    ITP (idiopathic thrombocytopenic purpura) 02/24/2011   Osteoporosis    PONV (postoperative nausea and vomiting)    Rheumatoid arthritis (HCC)    Thrombocytopenia (HCC)    hx of    Family History  Problem Relation Age of Onset   Hypertension Mother    Lung cancer  Mother    Heart disease Father    Hypertension Brother    Depression Daughter    Depression Son    Past Surgical History:  Procedure Laterality Date   ABDOMINAL HYSTERECTOMY     BREAST SURGERY Left    lumpectomy    CHOLECYSTECTOMY     MOHS SURGERY     x4   PARATHYROIDECTOMY Right 08/29/2021   Procedure: RIGHT INFERIOR PARATHYROIDECTOMY;  Surgeon: Armandina Gemma, MD;  Location: [facility];  Service: General;  Laterality: Right;   TUBAL LIGATION     Social History   Social History Narrative   Not on file   Immunization History  Administered Date(s) Administered   Moderna Sars-Covid-2 Vaccination 05/02/2019     Objective: Vital Signs: BP (!) 150/81 (BP Location: Left Arm, Patient Position: Sitting, Cuff Size: Normal)   Pulse 62   Resp 14   Ht '5\' 3"'$  (1.6 m)   Wt 133 lb (60.3 kg)   BMI 23.56 kg/m    Physical Exam Vitals and nursing note reviewed.  Constitutional:      Appearance: She is well-developed.  HENT:     Head: Normocephalic and atraumatic.  Eyes:     Conjunctiva/sclera: Conjunctivae normal.  Cardiovascular:     Rate and Rhythm: Normal rate and regular rhythm.     Heart sounds: Normal heart sounds.  Pulmonary:     Effort: Pulmonary effort is normal.     Breath sounds: Normal breath sounds.  Abdominal:     General: Bowel sounds are normal.     Palpations: Abdomen is soft.  Musculoskeletal:     Cervical back: Normal range of motion.  Lymphadenopathy:     Cervical: No cervical adenopathy.  Skin:    General: Skin is warm and dry.     Capillary Refill: Capillary refill takes less than 2 seconds.  Neurological:     Mental Status: She is alert and oriented to person, place, and time.  Psychiatric:        Behavior: Behavior normal.      Musculoskeletal Exam: Cervical, thoracic and lumbar spine were in good range of motion without discomfort.  Shoulders, elbows, wrist joints were in good range of motion.  She had bilateral CMC PIP and DIP thickening.  No  synovitis was noted over wrist joints or MCP joints.  Hip joints and knee joints were in good range of motion.  She had no tenderness over ankles or MTPs.  CDAI Exam: CDAI Score: -- Patient Global: 0 mm; Provider Global: 0 mm Swollen: --; Tender: -- Joint Exam 04/22/2022   No joint exam has been documented for this visit   There is currently no information documented on the homunculus. Go to the Rheumatology activity and complete the homunculus joint exam.  Investigation: No additional findings.  Imaging: No results found.  Recent Labs: Lab Results  Component Value Date   WBC 7.8 08/12/2021   HGB 15.7 (H) 08/12/2021   PLT 167 08/12/2021   NA 141 08/12/2021   K 4.2 08/12/2021  CL 108 08/12/2021   CO2 27 08/12/2021   GLUCOSE 93 08/12/2021   BUN 16 08/12/2021   CREATININE 0.75 08/12/2021   BILITOT 0.4 03/05/2020   ALKPHOS 90 11/09/2020   AST 31 11/09/2020   ALT 23 11/09/2020   PROT 6.7 03/05/2020   ALBUMIN 3.9 11/09/2020   CALCIUM 10.5 (H) 08/12/2021   GFRAA 79 03/05/2020    Speciality Comments: No specialty comments available.  Procedures:  No procedures performed Allergies: Other and Codeine   Assessment / Plan:     Visit Diagnoses: Rheumatoid arthritis of multiple sites with negative rheumatoid factor (HCC)-she denies joint swelling.  She continues to have some stiffness in her joints.  No synovitis was noted on the examination.  She has been off methotrexate since March 2020.  High risk medication use -she discontinued MTX (6 tabs/wk) in March 2020-Concern for covid/clinically doing well.  Primary osteoarthritis of both hands-she had bilateral PIP and DIP thickening and CMC thickening.  Joint protection muscle strengthening was advised.  Primary osteoarthritis of both feet-proper fitting shoes were advised.  Age-related osteoporosis without current pathological fracture - DEXA 01/30/20 T-score: -2.6, BMD: 0.559 left femoral neck. discontinued Boniva after her  DEXA on 01/18/18.  She has been avoiding calcium and vitamin D due to hyperparathyroidism.  She underwent parathyroidectomy in June 2023.  I advised her to discuss calcium and vitamin supplement with Dr. Buddy Duty, her endocrinologist.  She will get a DEXA scan soon.  She states she has been busy due to her mother's deteriorating health.  Elevated blood pressure reading blood pressure was 147/73.  Repeat blood pressure was 150/81.  She has been under a lot of stress.  Advised to monitor blood pressure closely and follow-up with her PCP.  Other medical problems are listed as follows:  History of depression  History of gastroesophageal reflux (GERD)  Idiopathic thrombocytopenic purpura ([place])  Orders: No orders of the defined types were placed in this encounter.  No orders of the defined types were placed in this encounter.    Follow-Up Instructions: Return in about 6 months (around 10/21/2022) for Osteoarthritis, Rheumatoid arthritis, Osteoporosis.   Bo Merino, MD  Note - This record has been created using Editor, commissioning.  Chart creation errors have been sought, but may not always  have been located. Such creation errors do not reflect on  the standard of medical care.

## 2022-04-22 ENCOUNTER — Ambulatory Visit: Payer: Medicare Other | Attending: Rheumatology | Admitting: Rheumatology

## 2022-04-22 ENCOUNTER — Encounter: Payer: Self-pay | Admitting: Rheumatology

## 2022-04-22 VITALS — BP 150/81 | HR 62 | Resp 14 | Ht 63.0 in | Wt 133.0 lb

## 2022-04-22 DIAGNOSIS — M19071 Primary osteoarthritis, right ankle and foot: Secondary | ICD-10-CM | POA: Diagnosis not present

## 2022-04-22 DIAGNOSIS — M810 Age-related osteoporosis without current pathological fracture: Secondary | ICD-10-CM

## 2022-04-22 DIAGNOSIS — M0609 Rheumatoid arthritis without rheumatoid factor, multiple sites: Secondary | ICD-10-CM

## 2022-04-22 DIAGNOSIS — Z8659 Personal history of other mental and behavioral disorders: Secondary | ICD-10-CM

## 2022-04-22 DIAGNOSIS — Z79899 Other long term (current) drug therapy: Secondary | ICD-10-CM | POA: Diagnosis not present

## 2022-04-22 DIAGNOSIS — Z8719 Personal history of other diseases of the digestive system: Secondary | ICD-10-CM

## 2022-04-22 DIAGNOSIS — R030 Elevated blood-pressure reading, without diagnosis of hypertension: Secondary | ICD-10-CM

## 2022-04-22 DIAGNOSIS — M19041 Primary osteoarthritis, right hand: Secondary | ICD-10-CM

## 2022-04-22 DIAGNOSIS — M19072 Primary osteoarthritis, left ankle and foot: Secondary | ICD-10-CM

## 2022-04-22 DIAGNOSIS — M19042 Primary osteoarthritis, left hand: Secondary | ICD-10-CM

## 2022-04-22 DIAGNOSIS — D693 Immune thrombocytopenic purpura: Secondary | ICD-10-CM

## 2022-05-05 DIAGNOSIS — H0288B Meibomian gland dysfunction left eye, upper and lower eyelids: Secondary | ICD-10-CM | POA: Diagnosis not present

## 2022-05-05 DIAGNOSIS — H0288A Meibomian gland dysfunction right eye, upper and lower eyelids: Secondary | ICD-10-CM | POA: Diagnosis not present

## 2022-05-13 DIAGNOSIS — D1801 Hemangioma of skin and subcutaneous tissue: Secondary | ICD-10-CM | POA: Diagnosis not present

## 2022-05-13 DIAGNOSIS — L570 Actinic keratosis: Secondary | ICD-10-CM | POA: Diagnosis not present

## 2022-05-13 DIAGNOSIS — L821 Other seborrheic keratosis: Secondary | ICD-10-CM | POA: Diagnosis not present

## 2022-05-13 DIAGNOSIS — D224 Melanocytic nevi of scalp and neck: Secondary | ICD-10-CM | POA: Diagnosis not present

## 2022-05-13 DIAGNOSIS — D485 Neoplasm of uncertain behavior of skin: Secondary | ICD-10-CM | POA: Diagnosis not present

## 2022-05-23 DIAGNOSIS — J101 Influenza due to other identified influenza virus with other respiratory manifestations: Secondary | ICD-10-CM | POA: Diagnosis not present

## 2022-06-11 DIAGNOSIS — D485 Neoplasm of uncertain behavior of skin: Secondary | ICD-10-CM | POA: Diagnosis not present

## 2022-06-11 DIAGNOSIS — D224 Melanocytic nevi of scalp and neck: Secondary | ICD-10-CM | POA: Diagnosis not present

## 2022-06-23 DIAGNOSIS — H25813 Combined forms of age-related cataract, bilateral: Secondary | ICD-10-CM | POA: Diagnosis not present

## 2022-07-03 IMAGING — NM NM PARATHYROID W/ SPECT
3 series · 18 of 18 positions shown · non-contrast
Comparison: None

CLINICAL DATA: Primary hyperparathyroidism, hypercalcemia

EXAM:
NM PARATHYROID SCINTIGRAPHY AND SPECT IMAGING
TECHNIQUE: Following intravenous administration of radiopharmaceutical, early
and 2-hour delayed planar images were obtained in the anterior
projection. Delayed triplanar SPECT images were also obtained at 2
hours.
RADIOPHARMACEUTICALS:  26 mCi Nc-KKm Sestamibi IV

[Series 1: spect - (id)_(id)_tra · 4.1mm · 4.14mm/px · 6 of 128 frames shown]
[frame 11/128]
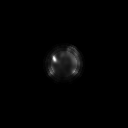
[frame 32/128]
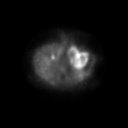
[frame 54/128]
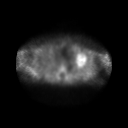
[frame 75/128]
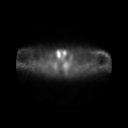
[frame 96/128]
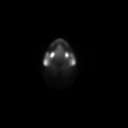
[frame 118/128]
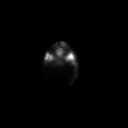

[Series 1: spect - (id)_(id)_sag · 4.1mm · 4.14mm/px · 6 of 128 frames shown]
[frame 11/128]
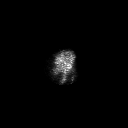
[frame 32/128]
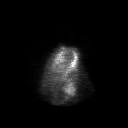
[frame 54/128]
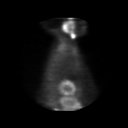
[frame 75/128]
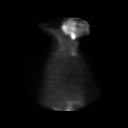
[frame 96/128]
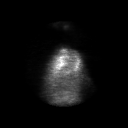
[frame 118/128]
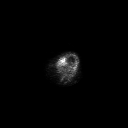

[Series 1: spect - (id)_(id)_cor · 4.1mm · 4.14mm/px · 6 of 128 frames shown]
[frame 11/128]
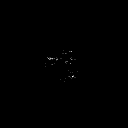
[frame 32/128]
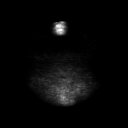
[frame 54/128]
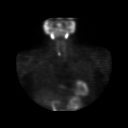
[frame 75/128]
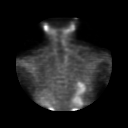
[frame 96/128]
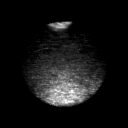
[frame 118/128]
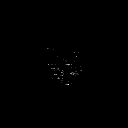

[18 of 18 positions shown; findings below may reference images not displayed]

FINDINGS: Planar imaging: Initial image demonstrates mildly asymmetric tracer
uptake at the mid inferior RIGHT thyroid lobe versus LEFT. [place]
image demonstrates focal abnormal sestamibi retention at the
inferior pole of the RIGHT thyroid lobe.

SPECT imaging: Focal abnormal sestamibi retention at the expected
position of the RIGHT inferior parathyroid gland consistent with
RIGHT inferior parathyroid adenoma. No abnormal tracer retention at
the expected positions of the remaining parathyroid glands. No
ectopic localization of tracer in the mediastinum.
IMPRESSION: Positive exam for presence of a RIGHT inferior parathyroid adenoma.

## 2022-07-14 DIAGNOSIS — K219 Gastro-esophageal reflux disease without esophagitis: Secondary | ICD-10-CM | POA: Diagnosis not present

## 2022-07-14 DIAGNOSIS — E785 Hyperlipidemia, unspecified: Secondary | ICD-10-CM | POA: Diagnosis not present

## 2022-07-14 DIAGNOSIS — Z79899 Other long term (current) drug therapy: Secondary | ICD-10-CM | POA: Diagnosis not present

## 2022-07-14 DIAGNOSIS — G4700 Insomnia, unspecified: Secondary | ICD-10-CM | POA: Diagnosis not present

## 2022-07-14 DIAGNOSIS — D692 Other nonthrombocytopenic purpura: Secondary | ICD-10-CM | POA: Diagnosis not present

## 2022-07-14 DIAGNOSIS — R079 Chest pain, unspecified: Secondary | ICD-10-CM | POA: Diagnosis not present

## 2022-07-14 DIAGNOSIS — D696 Thrombocytopenia, unspecified: Secondary | ICD-10-CM | POA: Diagnosis not present

## 2022-07-14 DIAGNOSIS — F419 Anxiety disorder, unspecified: Secondary | ICD-10-CM | POA: Diagnosis not present

## 2022-07-16 DIAGNOSIS — Z01818 Encounter for other preprocedural examination: Secondary | ICD-10-CM | POA: Diagnosis not present

## 2022-07-16 DIAGNOSIS — H25811 Combined forms of age-related cataract, right eye: Secondary | ICD-10-CM | POA: Diagnosis not present

## 2022-07-16 DIAGNOSIS — H52201 Unspecified astigmatism, right eye: Secondary | ICD-10-CM | POA: Diagnosis not present

## 2022-07-16 DIAGNOSIS — H2511 Age-related nuclear cataract, right eye: Secondary | ICD-10-CM | POA: Diagnosis not present

## 2022-07-16 DIAGNOSIS — H269 Unspecified cataract: Secondary | ICD-10-CM | POA: Diagnosis not present

## 2022-07-18 DIAGNOSIS — D759 Disease of blood and blood-forming organs, unspecified: Secondary | ICD-10-CM | POA: Insufficient documentation

## 2022-07-18 DIAGNOSIS — Z8489 Family history of other specified conditions: Secondary | ICD-10-CM | POA: Insufficient documentation

## 2022-07-18 DIAGNOSIS — I6782 Cerebral ischemia: Secondary | ICD-10-CM | POA: Insufficient documentation

## 2022-07-18 DIAGNOSIS — C801 Malignant (primary) neoplasm, unspecified: Secondary | ICD-10-CM | POA: Insufficient documentation

## 2022-07-18 DIAGNOSIS — F419 Anxiety disorder, unspecified: Secondary | ICD-10-CM | POA: Insufficient documentation

## 2022-07-18 DIAGNOSIS — T8859XA Other complications of anesthesia, initial encounter: Secondary | ICD-10-CM | POA: Insufficient documentation

## 2022-07-18 DIAGNOSIS — Z9889 Other specified postprocedural states: Secondary | ICD-10-CM | POA: Insufficient documentation

## 2022-07-18 DIAGNOSIS — F32A Depression, unspecified: Secondary | ICD-10-CM | POA: Insufficient documentation

## 2022-07-18 DIAGNOSIS — G4700 Insomnia, unspecified: Secondary | ICD-10-CM | POA: Insufficient documentation

## 2022-07-18 DIAGNOSIS — K219 Gastro-esophageal reflux disease without esophagitis: Secondary | ICD-10-CM | POA: Insufficient documentation

## 2022-07-18 DIAGNOSIS — M1990 Unspecified osteoarthritis, unspecified site: Secondary | ICD-10-CM | POA: Insufficient documentation

## 2022-07-21 NOTE — Progress Notes (Unsigned)
Chest pain Cardiology Office Note:    Date:  07/22/2022   ID:  Joy Fernandez, Joy Fernandez [birth year], MRN [id number]  PCP:  Philemon Kingdom, MD  Cardiologist:  Norman Herrlich, MD   Referring MD: Philemon Kingdom, MD  ASSESSMENT:    1. Chest pain of uncertain etiology    PLAN:    In order of problems listed above:  Symptoms are best described as possible angina, cardiovascular risk including rheumatoid arthritis hyperlipidemia and statin we discussed modalities for further evaluation and will undergo cardiac CTA. Continue her statin for hyperlipidemia  Next appointment 3 months   Medication Adjustments/Labs and Tests Ordered: Current medicines are reviewed at length with the patient today.  Concerns regarding medicines are outlined above.  No orders of the defined types were placed in this encounter.  No orders of the defined types were placed in this encounter.    Chief Complaint  Patient presents with   Chest Pain    History of Present Illness:    Joy Fernandez is a 77 y.o. female who is being seen today for the evaluation of chest pain at the request of Prochnau, Rayfield Citizen, MD.  She has multiple medical problems including polycythemia rheumatoid arthritis hyperparathyroidism.  We both recognize each other of cure for her husband as well as her mother for many years who died last year over 51 years old She has been under tremendous amount of stress and at times is aware of her chest she will quite use the boric pain but there is an awareness of discomfort left chest left shoulder a waxing waning comes and goes the symptoms are not exertional Yesterday concerned that she may have heart disease She has no exertional chest pain edema shortness of breath palpitation or syncope Chest pain is not pleuritic no associated GI symptoms or diaphoresis She has no history of heart disease congenital rheumatic or atrial fibrillation Is unclear rheumatoid arthritis is quiet and she is not  taking suppressive treatment. Past Medical History:  Diagnosis Date   Anxiety    Arthritis    Blood dyscrasia    low platelets - no longer followed by hematology   Cancer    hx of skin cancers   Complication of anesthesia    Depression    Family history of adverse reaction to anesthesia    son - when young had surgery and woke up and could not move could not break down the chemical and your husband tested positive for same condition son has had surgery since and done ok husband has done okwith surgeries   GERD (gastroesophageal reflux disease)    Insomnia    Ischemic cerebrovascular disease    ITP (idiopathic thrombocytopenic purpura) 02/24/2011   Osteoporosis    PONV (postoperative nausea and vomiting)    Rheumatoid arthritis    Thrombocytopenia    hx of    Past Surgical History:  Procedure Laterality Date   ABDOMINAL HYSTERECTOMY     BREAST SURGERY Left    lumpectomy    CHOLECYSTECTOMY     MOHS SURGERY     x4   PARATHYROIDECTOMY Right 08/29/2021   Procedure: RIGHT INFERIOR PARATHYROIDECTOMY;  Surgeon: Darnell Level, MD;  Location: [facility];  Service: General;  Laterality: Right;   TUBAL LIGATION      Current Medications: Current Meds  Medication Sig   acetaminophen (TYLENOL) 500 MG tablet Take 500 mg by mouth every 8 (eight) hours as needed for moderate pain.   ALPRAZolam (XANAX) 0.25 MG tablet  Take 0.125-0.25 mg by mouth 2 (two) times daily as needed for anxiety.   Carboxymethylcell-Glycerin PF (REFRESH RELIEVA PF) 0.5-1 % SOLN Place 1 drop into both eyes daily as needed (dry eyes).   Cholecalciferol (VITAMIN D) 50 MCG (2000 UT) tablet Take 2,000 Units by mouth 3 (three) times a week.   Coenzyme Q10 (COQ10) 100 MG CAPS Take 100 mg by mouth daily.   L-Lysine 500 MG CAPS Take 500 mg by mouth 2 (two) times a week.   pantoprazole (PROTONIX) 40 MG tablet Take 40 mg by mouth daily as needed (acid reflux).   rosuvastatin (CRESTOR) 5 MG tablet Take 5 mg by mouth daily.   VITAMIN  A PO Take by mouth every other day.   Wheat Dextrin (BENEFIBER) POWD Take 2 Scoops by mouth daily.   zolpidem (AMBIEN) 10 MG tablet Take 5-10 mg by mouth at bedtime.     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  Tobacco Use   Smoking status: Never    Passive exposure: Past   Smokeless tobacco: Never  Vaping Use   Vaping Use: Never used  Substance and Sexual Activity   Alcohol use: No   Drug use: No   Sexual activity: Not on file  Other Topics Concern   Not on file  Social History Narrative   Not on file   Social Determinants of Health   Financial Resource Strain: Not on file  Food Insecurity: Not on file  Transportation Needs: Not on file  Physical Activity: Not on file  Stress: Not on file  Social Connections: Not on file     Family History: The patient's family history includes Depression in her daughter and son; Heart disease in her father; Hypertension in her brother and mother; Lung cancer in her mother.  ROS:   ROS Please see the history of present illness.     All other systems reviewed and are negative.  EKGs/Labs/Other Studies Reviewed:    The following studies were reviewed today:       EKG: I independently reviewed the EKG from PCP office from 07/14/2022 showing sinus rhythm left axis deviation left anterior hemiblock no acute ischemic changes  Recent Labs: 08/12/2021: BUN 16; Creatinine, Ser 0.75; Hemoglobin 15.7; Platelets 167; Potassium 4.2; Sodium 141  Recent Lipid Panel No results found for: "CHOL", "TRIG", "HDL", "CHOLHDL", "VLDL", "LDLCALC", "LDLDIRECT"  Physical Exam:    VS:  BP (!) 156/78 (BP Location: Right Arm, Patient Position: Sitting, Cuff Size: Normal)   Pulse 69   Ht  (1.6 m)    Wt 138 lb 9.6 oz (62.9 kg)   SpO2 99%   BMI 24.55 kg/m     Wt Readings from Last 3 Encounters:  07/22/22 138 lb 9.6 oz (62.9 kg)  04/22/22 133 lb (60.3 kg)  08/29/21 136 lb (61.7 kg)     GEN:  Well nourished, well developed in no acute distress HEENT: Normal NECK: No JVD; No carotid bruits LYMPHATICS: No lymphadenopathy CARDIAC: RRR, no murmurs, rubs, gallops RESPIRATORY:  Clear to auscultation without rales, wheezing or rhonchi  ABDOMEN: Soft, non-tender, non-distended MUSCULOSKELETAL:  No edema; No deformity  SKIN: Warm and dry NEUROLOGIC:  Alert and oriented  x 3 PSYCHIATRIC:  Normal affect     Signed, Norman Herrlich, MD  07/22/2022 3:16 PM    [facility]

## 2022-07-22 ENCOUNTER — Ambulatory Visit: Payer: Medicare Other | Attending: Cardiology | Admitting: Cardiology

## 2022-07-22 ENCOUNTER — Encounter: Payer: Self-pay | Admitting: Cardiology

## 2022-07-22 VITALS — BP 156/78 | HR 69 | Ht 63.0 in | Wt 138.6 lb

## 2022-07-22 DIAGNOSIS — E785 Hyperlipidemia, unspecified: Secondary | ICD-10-CM

## 2022-07-22 DIAGNOSIS — R072 Precordial pain: Secondary | ICD-10-CM | POA: Diagnosis not present

## 2022-07-22 DIAGNOSIS — R079 Chest pain, unspecified: Secondary | ICD-10-CM

## 2022-07-22 MED ORDER — METOPROLOL TARTRATE 100 MG PO TABS: 100.0000 mg | ORAL_TABLET | Freq: Once | ORAL | 0 refills | Status: DC

## 2022-07-22 NOTE — Patient Instructions (Addendum)
Medication Instructions:  Your physician recommends that you continue on your current medications as directed. Please refer to the Current Medication list given to you today.  *If you need a refill on your cardiac medications before your next appointment, please call your pharmacy*   Lab Work: Your physician recommends that you return for lab work in:   Labs 1 week before CT: BMP  If you have labs (blood work) drawn today and your tests are completely normal, you will receive your results only by: MyChart Message (if you have MyChart) OR A paper copy in the mail If you have any lab test that is abnormal or we need to change your treatment, we will call you to review the results.   Testing/Procedures:   Your cardiac CT will be scheduled at one of the below locations:   [facility] [address] [place], [place] [address] [phone]  OR  [facility] [address] [place], [place] [address] [phone]  OR   [facility] [address] [place], [place] [address] [phone]  If scheduled at [facility], please arrive at the [facility]'s Entrance (Entrance C2) of [facility] 30 minutes prior to test start time. You can use the FREE valet parking offered at entrance C (encouraged to control the heart rate for the test)  Proceed to the [facility] Radiology Department (first floor) to check-in and test prep.  All radiology patients and guests should use entrance C2 at [facility], accessed from [facility], even though the hospital's physical address listed is [address].    If scheduled at [facility] or [facility], please arrive 15 mins early for check-in and test prep.   Please follow these instructions carefully (unless otherwise directed):  On the Night Before the Test: Be  sure to Drink plenty of water. Do not consume any caffeinated/decaffeinated beverages or chocolate 12 hours prior to your test. Do not take any antihistamines 12 hours prior to your test.  On the Day of the Test: Drink plenty of water until 1 hour prior to the test. Do not eat any food 1 hour prior to test. You may take your regular medications prior to the test.  Take metoprolol (Lopressor) two hours prior to test. FEMALES- please wear underwire-free bra if available, avoid dresses & tight clothing      After the Test: Drink plenty of water. After receiving IV contrast, you may experience a mild flushed feeling. This is normal. On occasion, you may experience a mild rash up to 24 hours after the test. This is not dangerous. If this occurs, you can take Benadryl 25 mg and increase your fluid intake. If you experience trouble breathing, this can be serious. If it is severe call 911 IMMEDIATELY. If it is mild, please call our office. If you take any of these medications: Glipizide/Metformin, Avandament, Glucavance, please do not take 48 hours after completing test unless otherwise instructed.  We will call to schedule your test 2-4 weeks out understanding that some insurance companies will need an authorization prior to the service being performed.   For non-scheduling related questions, please contact the cardiac imaging nurse navigator should you have any questions/concerns: Rockwell Alexandria, Cardiac Imaging Nurse Navigator Larey Brick, Cardiac Imaging Nurse Navigator [facility] Direct Office Dial: [phone]   For scheduling needs, including cancellations and rescheduling, please  call [place], [phone].    Follow-Up: At [facility], you and your health needs are our priority.  As part of our continuing mission to provide you with exceptional heart care, we have created designated Provider Care Teams.  These Care Teams include your primary  Cardiologist (physician) and Advanced Practice Providers (APPs -  Physician Assistants and Nurse Practitioners) who all work together to provide you with the care you need, when you need it.  We recommend signing up for the patient portal called "MyChart".  Sign up information is provided on this After Visit Summary.  MyChart is used to connect with patients for Virtual Visits (Telemedicine).  Patients are able to view lab/test results, encounter notes, upcoming appointments, etc.  Non-urgent messages can be sent to your provider as well.   To learn more about what you can do with MyChart, go to [email or web address].    Your next appointment:   3 month(s)  Provider:   Shirlee More, MD    Other Instructions None

## 2022-07-22 NOTE — Addendum Note (Signed)
Addended by: Roxanne Mins I on: 07/22/2022 04:05 PM   Modules accepted: Orders

## 2022-07-22 NOTE — Addendum Note (Signed)
Addended by: Smiley Houseman B on: 07/22/2022 03:59 PM   Modules accepted: Orders

## 2022-07-23 ENCOUNTER — Other Ambulatory Visit: Payer: Self-pay

## 2022-07-25 DIAGNOSIS — L578 Other skin changes due to chronic exposure to nonionizing radiation: Secondary | ICD-10-CM | POA: Diagnosis not present

## 2022-07-25 DIAGNOSIS — D225 Melanocytic nevi of trunk: Secondary | ICD-10-CM | POA: Diagnosis not present

## 2022-07-25 DIAGNOSIS — L570 Actinic keratosis: Secondary | ICD-10-CM | POA: Diagnosis not present

## 2022-07-25 DIAGNOSIS — B351 Tinea unguium: Secondary | ICD-10-CM | POA: Diagnosis not present

## 2022-07-25 DIAGNOSIS — L821 Other seborrheic keratosis: Secondary | ICD-10-CM | POA: Diagnosis not present

## 2022-07-28 ENCOUNTER — Telehealth (HOSPITAL_COMMUNITY): Payer: Self-pay | Admitting: Emergency Medicine

## 2022-07-28 NOTE — Telephone Encounter (Signed)
Attempted to call patient regarding upcoming cardiac CT appointment. °Left message on voicemail with name and callback number °Taimane Stimmel RN Navigator Cardiac Imaging °[facility] °[phone] Office °[phone] Cell ° °

## 2022-07-29 ENCOUNTER — Ambulatory Visit (HOSPITAL_COMMUNITY)
Admission: RE | Admit: 2022-07-29 | Discharge: 2022-07-29 | Disposition: A | Discharge status: Home / Self Care | Payer: Medicare Other | Source: Ambulatory Visit | Attending: Cardiology | Admitting: Cardiology

## 2022-07-29 DIAGNOSIS — R072 Precordial pain: Secondary | ICD-10-CM

## 2022-07-29 MED ORDER — IOHEXOL 350 MG/ML SOLN
95.0000 mL | Freq: Once | INTRAVENOUS | Status: AC | PRN
  Administered 2022-07-29: 95 mL via INTRAVENOUS

## 2022-07-29 MED ORDER — NITROGLYCERIN 0.4 MG SL SUBL
SUBLINGUAL_TABLET | SUBLINGUAL | Status: AC
  Filled 2022-07-29: qty 2

## 2022-07-29 MED ORDER — NITROGLYCERIN 0.4 MG SL SUBL
0.8000 mg | SUBLINGUAL_TABLET | Freq: Once | SUBLINGUAL | Status: AC
  Administered 2022-07-29: 0.8 mg via SUBLINGUAL

## 2022-07-30 ENCOUNTER — Telehealth: Payer: Self-pay | Admitting: Cardiology

## 2022-07-30 DIAGNOSIS — H25812 Combined forms of age-related cataract, left eye: Secondary | ICD-10-CM | POA: Diagnosis not present

## 2022-07-30 DIAGNOSIS — H269 Unspecified cataract: Secondary | ICD-10-CM | POA: Diagnosis not present

## 2022-07-30 DIAGNOSIS — H2512 Age-related nuclear cataract, left eye: Secondary | ICD-10-CM | POA: Diagnosis not present

## 2022-07-30 DIAGNOSIS — H52222 Regular astigmatism, left eye: Secondary | ICD-10-CM | POA: Diagnosis not present

## 2022-07-30 NOTE — Telephone Encounter (Signed)
Patient is calling back requesting her cell number be called regarding this due to being out of town. Please advise.

## 2022-07-30 NOTE — Telephone Encounter (Signed)
Patient is returning call. Please advise? 

## 2022-07-31 NOTE — Telephone Encounter (Signed)
Patient returned call, please reach her at [phone].

## 2022-08-01 NOTE — Telephone Encounter (Signed)
Patient informed of results.  

## 2022-08-12 DIAGNOSIS — L570 Actinic keratosis: Secondary | ICD-10-CM | POA: Diagnosis not present

## 2022-08-26 DIAGNOSIS — J988 Other specified respiratory disorders: Secondary | ICD-10-CM | POA: Diagnosis not present

## 2022-08-26 DIAGNOSIS — J019 Acute sinusitis, unspecified: Secondary | ICD-10-CM | POA: Diagnosis not present

## 2022-08-26 DIAGNOSIS — B9689 Other specified bacterial agents as the cause of diseases classified elsewhere: Secondary | ICD-10-CM | POA: Diagnosis not present

## 2022-10-07 NOTE — Progress Notes (Deleted)
Office Visit Note  Patient: Joy Fernandez             Date of Birth: December 27, 1945           MRN: [id number]             PCP: Philemon Kingdom, MD Referring: Philemon Kingdom, MD Visit Date: 10/21/2022 Occupation: @GUAROCC @  Subjective:  No chief complaint on file.   History of Present Illness: Joy Fernandez is a 77 y.o. female ***     Activities of Daily Living:  Patient reports morning stiffness for *** {minute/hour:19697}.   Patient {ACTIONS;DENIES/REPORTS:21021675::"Denies"} nocturnal pain.  Difficulty dressing/grooming: {ACTIONS;DENIES/REPORTS:21021675::"Denies"} Difficulty climbing stairs: {ACTIONS;DENIES/REPORTS:21021675::"Denies"} Difficulty getting out of chair: {ACTIONS;DENIES/REPORTS:21021675::"Denies"} Difficulty using hands for taps, buttons, cutlery, and/or writing: {ACTIONS;DENIES/REPORTS:21021675::"Denies"}  No Rheumatology ROS completed.   PMFS History:  Patient Active Problem List   Diagnosis Date Noted   PONV (postoperative nausea and vomiting) 07/18/2022   Ischemic cerebrovascular disease 07/18/2022   Insomnia 07/18/2022   GERD (gastroesophageal reflux disease) 07/18/2022   Family history of adverse reaction to anesthesia 07/18/2022   Depression 07/18/2022   Complication of anesthesia 07/18/2022   Cancer (HCC) 07/18/2022   Blood dyscrasia 07/18/2022   Arthritis 07/18/2022   Anxiety 07/18/2022   Hyperparathyroidism, primary (HCC) 08/28/2021   Family history of skin cancer 04/23/2021   History of malignant neoplasm of skin 04/23/2021   History of recurrent UTIs 03/27/2021   Polycythemia, secondary 11/14/2020   History of depression 06/06/2016   History of gastroesophageal reflux (GERD) 06/06/2016   Rheumatoid arthritis of multiple sites with negative rheumatoid factor (HCC) 05/30/2016   Primary osteoarthritis of both feet 05/30/2016   Primary osteoarthritis of both hands 05/30/2016   High risk medication use 05/30/2016   Age-related osteoporosis  without current pathological fracture 05/30/2016   Lipoma of abdominal wall 08/01/2015   Idiopathic thrombocytopenic purpura (HCC) 02/24/2011    Past Medical History:  Diagnosis Date   Anxiety    Arthritis    Blood dyscrasia    low platelets - no longer followed by hematology   Cancer (HCC)    hx of skin cancers   Complication of anesthesia    Depression    Family history of adverse reaction to anesthesia    son - when young had surgery and woke up and could not move could not break down the chemical and your husband tested positive for same condition son has had surgery since and done ok husband has done okwith surgeries   GERD (gastroesophageal reflux disease)    Insomnia    Ischemic cerebrovascular disease    ITP (idiopathic thrombocytopenic purpura) 02/24/2011   Osteoporosis    PONV (postoperative nausea and vomiting)    Rheumatoid arthritis (HCC)    Thrombocytopenia (HCC)    hx of    Family History  Problem Relation Age of Onset   Hypertension Mother    Lung cancer Mother    Heart disease Father    Hypertension Brother    Depression Daughter    Depression Son    Past Surgical History:  Procedure Laterality Date   ABDOMINAL HYSTERECTOMY     BREAST SURGERY Left    lumpectomy    CHOLECYSTECTOMY     MOHS SURGERY     x4   PARATHYROIDECTOMY Right 08/29/2021   Procedure: RIGHT INFERIOR PARATHYROIDECTOMY;  Surgeon: Darnell Level, MD;  Location: [facility];  Service: General;  Laterality: Right;   TUBAL LIGATION     Social History   Social History  Narrative   Not on file   Immunization History  Administered Date(s) Administered   Moderna Sars-Covid-2 Vaccination 05/02/2019     Objective: Vital Signs: There were no vitals taken for this visit.   Physical Exam   Musculoskeletal Exam: ***  CDAI Exam: CDAI Score: -- Patient Global: --; Provider Global: -- Swollen: --; Tender: -- Joint Exam 10/21/2022   No joint exam has been documented for this visit   There  is currently no information documented on the homunculus. Go to the Rheumatology activity and complete the homunculus joint exam.  Investigation: No additional findings.  Imaging: No results found.  Recent Labs: Lab Results  Component Value Date   WBC 7.8 08/12/2021   HGB 15.7 (H) 08/12/2021   PLT 167 08/12/2021   NA 141 08/12/2021   K 4.2 08/12/2021   CL 108 08/12/2021   CO2 27 08/12/2021   GLUCOSE 93 08/12/2021   BUN 16 08/12/2021   CREATININE 0.75 08/12/2021   BILITOT 0.4 03/05/2020   ALKPHOS 90 11/09/2020   AST 31 11/09/2020   ALT 23 11/09/2020   PROT 6.7 03/05/2020   ALBUMIN 3.9 11/09/2020   CALCIUM 10.5 (H) 08/12/2021   GFRAA 79 03/05/2020    Speciality Comments: No specialty comments available.  Procedures:  No procedures performed Allergies: Other and Codeine   Assessment / Plan:     Visit Diagnoses: Rheumatoid arthritis of multiple sites with negative rheumatoid factor (HCC)  High risk medication use  Primary osteoarthritis of both hands  Primary osteoarthritis of both feet  Age-related osteoporosis without current pathological fracture  History of depression  History of gastroesophageal reflux (GERD)  Idiopathic thrombocytopenic purpura (HCC)  Elevated blood pressure reading  Orders: No orders of the defined types were placed in this encounter.  No orders of the defined types were placed in this encounter.   Face-to-face time spent with patient was *** minutes. Greater than 50% of time was spent in counseling and coordination of care.  Follow-Up Instructions: No follow-ups on file.   Gearldine Bienenstock, PA-C  Note - This record has been created using Dragon software.  Chart creation errors have been sought, but may not always  have been located. Such creation errors do not reflect on  the standard of medical care.

## 2022-10-13 DIAGNOSIS — Z1231 Encounter for screening mammogram for malignant neoplasm of breast: Secondary | ICD-10-CM | POA: Diagnosis not present

## 2022-10-15 DIAGNOSIS — H02831 Dermatochalasis of right upper eyelid: Secondary | ICD-10-CM | POA: Diagnosis not present

## 2022-10-15 DIAGNOSIS — H02834 Dermatochalasis of left upper eyelid: Secondary | ICD-10-CM | POA: Diagnosis not present

## 2022-10-20 ENCOUNTER — Other Ambulatory Visit: Payer: Self-pay

## 2022-10-20 DIAGNOSIS — D696 Thrombocytopenia, unspecified: Secondary | ICD-10-CM | POA: Insufficient documentation

## 2022-10-20 DIAGNOSIS — L570 Actinic keratosis: Secondary | ICD-10-CM

## 2022-10-20 DIAGNOSIS — M810 Age-related osteoporosis without current pathological fracture: Secondary | ICD-10-CM | POA: Insufficient documentation

## 2022-10-20 DIAGNOSIS — M069 Rheumatoid arthritis, unspecified: Secondary | ICD-10-CM | POA: Insufficient documentation

## 2022-10-20 HISTORY — DX: Actinic keratosis: L57.0

## 2022-10-21 ENCOUNTER — Ambulatory Visit: Payer: Medicare Other | Admitting: Physician Assistant

## 2022-10-21 DIAGNOSIS — Z79899 Other long term (current) drug therapy: Secondary | ICD-10-CM

## 2022-10-21 DIAGNOSIS — M810 Age-related osteoporosis without current pathological fracture: Secondary | ICD-10-CM

## 2022-10-21 DIAGNOSIS — Z8659 Personal history of other mental and behavioral disorders: Secondary | ICD-10-CM

## 2022-10-21 DIAGNOSIS — D693 Immune thrombocytopenic purpura: Secondary | ICD-10-CM

## 2022-10-21 DIAGNOSIS — R030 Elevated blood-pressure reading, without diagnosis of hypertension: Secondary | ICD-10-CM

## 2022-10-21 DIAGNOSIS — M19041 Primary osteoarthritis, right hand: Secondary | ICD-10-CM

## 2022-10-21 DIAGNOSIS — Z8719 Personal history of other diseases of the digestive system: Secondary | ICD-10-CM

## 2022-10-21 DIAGNOSIS — M0609 Rheumatoid arthritis without rheumatoid factor, multiple sites: Secondary | ICD-10-CM

## 2022-10-21 DIAGNOSIS — M19071 Primary osteoarthritis, right ankle and foot: Secondary | ICD-10-CM

## 2022-10-21 NOTE — Progress Notes (Unsigned)
Cardiology Office Note:    Date:  10/22/2022   ID:  Joy Fernandez, Joy Fernandez Jul 25, 1945, MRN [id number]  PCP:  Philemon Kingdom, MD  Cardiologist:  Norman Herrlich, MD    Referring MD: Philemon Kingdom, MD    ASSESSMENT:    1. Mild CAD   2. Agatston coronary artery calcium score less than 100    PLAN:    In order of problems listed above:  Stable not unexpected in her age group to have some coronary atherosclerosis the degree is quite mild puts her at a low risk group and I advised her to continue aspirin her current lipid-lowering with high intensity statin well-tolerated and activity.  She will follow-up labs at her PCP office She would like to follow-up with me in 1 year to keep an eye on her cardiac issues     Medication Adjustments/Labs and Tests Ordered: Current medicines are reviewed at length with the patient today.  Concerns regarding medicines are outlined above.  Orders Placed This Encounter  Procedures   EKG 12-Lead   No orders of the defined types were placed in this encounter.    History of Present Illness:    Joy Fernandez is a 77 y.o. female with a hx of polycythemia rheumatoid arthritis hyperparathyroidism and chest pain last seen 0 4-23 2024.  Following the visit she underwent cardiac CTA May reported 07/29/2022 calcium score was low at 6 22nd percentile and there was very mild nonobstructive CAD in the proximal LAD less than 25% stenosis.  Compliance with diet, lifestyle and medications: Yes  She is doing much better but still under a great deal of stress managing her mother's estate Not having chest pain edema shortness of breath palpitation or syncope She tolerates her statin and takes aspirin She no longer takes her hormone replacement therapy She is reassured by the results of her cardiac CTA with a low coronary calcium score of less than 10 and very mild nonobstructive plaque in the proximal LAD Past Medical History:  Diagnosis Date   Abnormal vaginal  bleeding 05/30/2014   Actinic keratosis 10/20/2022   Age-related osteoporosis without current pathological fracture 05/30/2016   DEXA 12/25/2015 T score -3.3     Anxiety    Arthritis    Blood dyscrasia    low platelets - no longer followed by hematology   Cancer (HCC)    hx of skin cancers   Complication of anesthesia    Depression    Family history of adverse reaction to anesthesia    son - when young had surgery and woke up and could not move could not break down the chemical and your husband tested positive for same condition son has had surgery since and done ok husband has done okwith surgeries   Family history of skin cancer 04/23/2021   GERD (gastroesophageal reflux disease)    High risk medication use 05/30/2016   Methotrexate      History of depression 06/06/2016   History of gastroesophageal reflux (GERD) 06/06/2016   History of malignant neoplasm of skin 04/23/2021   History of recurrent UTIs 03/27/2021   Hyperparathyroidism, primary (HCC) 08/28/2021   Idiopathic thrombocytopenic purpura (HCC) 02/24/2011   Insomnia    Ischemic cerebrovascular disease    Lipoma of abdominal wall 08/01/2015   Osteoporosis    Polycythemia, secondary 11/14/2020   PONV (postoperative nausea and vomiting)    Primary osteoarthritis of both feet 05/30/2016   Primary osteoarthritis of both hands 05/30/2016   Rheumatoid arthritis (HCC)  Rheumatoid arthritis of multiple sites with negative rheumatoid factor (HCC) 05/30/2016   Status post parathyroidectomy 09/12/2021   Thrombocytopenia (HCC)    hx of   Vaginal atrophy 10/05/2014    Current Medications: Current Meds  Medication Sig   acetaminophen (TYLENOL) 500 MG tablet Take 500 mg by mouth every 8 (eight) hours as needed for moderate pain.   ALPRAZolam (XANAX) 0.25 MG tablet Take 0.125-0.25 mg by mouth 2 (two) times daily as needed for anxiety.   aspirin EC 81 MG tablet Take 81 mg by mouth 2 (two) times a week. Swallow whole.    Carboxymethylcell-Glycerin PF (REFRESH RELIEVA PF) 0.5-1 % SOLN Place 1 drop into both eyes daily as needed (dry eyes).   Cholecalciferol (VITAMIN D) 50 MCG (2000 UT) tablet Take 2,000 Units by mouth 3 (three) times a week.   Coenzyme Q10 (COQ10) 100 MG CAPS Take 100 mg by mouth daily.   estradiol (ESTRACE) 0.5 MG tablet Take 0.5 mg by mouth daily.   L-Lysine 500 MG CAPS Take 500 mg by mouth 2 (two) times a week.   pantoprazole (PROTONIX) 40 MG tablet Take 40 mg by mouth daily as needed (acid reflux).   rosuvastatin (CRESTOR) 5 MG tablet Take 5 mg by mouth daily.   VITAMIN A PO Take 1 tablet by mouth every other day.   Wheat Dextrin (BENEFIBER) POWD Take 2 Scoops by mouth daily.   zolpidem (AMBIEN) 10 MG tablet Take 5-10 mg by mouth at bedtime.      EKGs/Labs/Other Studies Reviewed:    The following studies were reviewed today:  Cardiac Studies & Procedures          CT SCANS  CT CORONARY MORPH W/CTA COR W/SCORE 07/29/2022  Addendum 08/02/2022 12:36 PM ADDENDUM REPORT: 08/02/2022 12:33  EXAM: OVER-READ INTERPRETATION  CT CHEST  The following report is an over-read performed by radiologist Dr. Karle Barr [facility] on 08/02/2022. This over-read does not include interpretation of cardiac or coronary anatomy or pathology. The coronary CTA interpretation by the cardiologist is attached.  COMPARISON:  None.  FINDINGS: Aorta normal caliber. No pericardial effusion. Pulmonary arteries patent. Small hiatal hernia. No adenopathy. Visualized upper abdomen unremarkable. Linear subsegmental atelectasis LEFT lower lobe. Lungs otherwise clear. Osseous demineralization.  IMPRESSION: Small hiatal hernia.   Electronically Signed By: Ulyses Southward M.D. On: 08/02/2022 12:33  Narrative CLINICAL DATA:  76F with chest pain  EXAM: Cardiac/Coronary CTA  TECHNIQUE: The patient was scanned on a [employer].  FINDINGS: A 100 kV prospective scan was triggered in  the descending thoracic aorta at 111 HU's. Axial non-contrast 3 mm slices were carried out through the heart. The data set was analyzed on a dedicated work station and scored using the Agatson method. Gantry rotation speed was 250 msecs and collimation was .6 mm. No beta blockade and 0.8 mg of sl NTG was given. The 3D data set was reconstructed in 5% intervals of the 35-75% of the R-R cycle. Phases were analyzed on a dedicated work station using MPR, MIP and VRT modes. The patient received 100 cc of contrast.  Coronary Arteries:  Normal coronary origin.  Right dominance.  RCA is a large dominant artery that gives rise to PDA and PLA. There is no plaque.  Left main is a large artery that gives rise to LAD and LCX arteries.  LAD is a large vessel. Mixed plaque in proximal LAD causes 0-24% stenosis  LCX is a non-dominant artery that gives rise to one  large OM1 branch. There is no plaque.  Other findings:  Left Ventricle: Normal size  Left Atrium: Mild enlargement  Pulmonary Veins: Normal configuration  Right Ventricle: Normal size  Right Atrium: Normal size  Cardiac valves: Mild mitral annular calcifications  Thoracic aorta: Normal size  Pulmonary Arteries: Normal size  Systemic Veins: Normal drainage  Pericardium: Normal thickness  IMPRESSION: 1. Coronary calcium score of 6. This was 22nd percentile for age and sex matched control.  2. Normal coronary origin with right dominance.  3. Nonobstructive CAD, with mixed plaque in proximal LAD causing minimal (0-24%) stenosis  CAD-RADS 1. Minimal non-obstructive CAD (0-24%). Consider non-atherosclerotic causes of chest pain. Consider preventive therapy and risk factor modification.  Electronically Signed: By: Epifanio Lesches M.D. On: 07/29/2022 22:26          Physical Exam:    VS:  BP 110/60   Pulse 66   Ht 5\' 3"  (1.6 m)   Wt 143 lb (64.9 kg)   SpO2 95%   BMI 25.33 kg/m     Wt Readings from Last  3 Encounters:  10/22/22 143 lb (64.9 kg)  07/22/22 138 lb 9.6 oz (62.9 kg)  04/22/22 133 lb (60.3 kg)     GEN:  Well nourished, well developed in no acute distress HEENT: Normal NECK: No JVD; No carotid bruits LYMPHATICS: No lymphadenopathy CARDIAC: RRR, no murmurs, rubs, gallops RESPIRATORY:  Clear to auscultation without rales, wheezing or rhonchi  ABDOMEN: Soft, non-tender, non-distended MUSCULOSKELETAL:  No edema; No deformity  SKIN: Warm and dry NEUROLOGIC:  Alert and oriented x 3 PSYCHIATRIC:  Normal affect    Signed, Norman Herrlich, MD  10/22/2022 3:05 PM    [facility]

## 2022-10-22 ENCOUNTER — Ambulatory Visit: Payer: Medicare Other | Attending: Cardiology | Admitting: Cardiology

## 2022-10-22 ENCOUNTER — Encounter: Payer: Self-pay | Admitting: Cardiology

## 2022-10-22 VITALS — BP 110/60 | HR 66 | Ht 63.0 in | Wt 143.0 lb

## 2022-10-22 DIAGNOSIS — I2510 Atherosclerotic heart disease of native coronary artery without angina pectoris: Secondary | ICD-10-CM

## 2022-10-22 DIAGNOSIS — R931 Abnormal findings on diagnostic imaging of heart and coronary circulation: Secondary | ICD-10-CM | POA: Diagnosis not present

## 2022-10-22 NOTE — Patient Instructions (Signed)

## 2022-10-22 NOTE — Progress Notes (Deleted)
Office Visit Note  Patient: Joy Fernandez             Date of Birth: 06-15-1945           MRN: [id number]             PCP: Philemon Kingdom, MD Referring: Philemon Kingdom, MD Visit Date: 10/29/2022 Occupation: @GUAROCC @  Subjective:    History of Present Illness: Joy Fernandez is a 77 y.o. female with history of seronegative rheumatoid arthritis and osteoarthritis.  She is not currently taking any immunosuppressive agents.      Activities of Daily Living:  Patient reports morning stiffness for *** {minute/hour:19697}.   Patient {ACTIONS;DENIES/REPORTS:21021675::"Denies"} nocturnal pain.  Difficulty dressing/grooming: {ACTIONS;DENIES/REPORTS:21021675::"Denies"} Difficulty climbing stairs: {ACTIONS;DENIES/REPORTS:21021675::"Denies"} Difficulty getting out of chair: {ACTIONS;DENIES/REPORTS:21021675::"Denies"} Difficulty using hands for taps, buttons, cutlery, and/or writing: {ACTIONS;DENIES/REPORTS:21021675::"Denies"}  No Rheumatology ROS completed.   PMFS History:  Patient Active Problem List   Diagnosis Date Noted  . Actinic keratosis 10/20/2022  . Osteoporosis   . Rheumatoid arthritis (HCC)   . Thrombocytopenia (HCC)   . PONV (postoperative nausea and vomiting) 07/18/2022  . Ischemic cerebrovascular disease 07/18/2022  . Insomnia 07/18/2022  . GERD (gastroesophageal reflux disease) 07/18/2022  . Family history of adverse reaction to anesthesia 07/18/2022  . Depression 07/18/2022  . Complication of anesthesia 07/18/2022  . Cancer (HCC) 07/18/2022  . Blood dyscrasia 07/18/2022  . Arthritis 07/18/2022  . Anxiety 07/18/2022  . Status post parathyroidectomy 09/12/2021  . Hyperparathyroidism, primary (HCC) 08/28/2021  . Family history of skin cancer 04/23/2021  . History of malignant neoplasm of skin 04/23/2021  . History of recurrent UTIs 03/27/2021  . Polycythemia, secondary 11/14/2020  . History of depression 06/06/2016  . History of gastroesophageal reflux (GERD)  06/06/2016  . Rheumatoid arthritis of multiple sites with negative rheumatoid factor (HCC) 05/30/2016  . Primary osteoarthritis of both feet 05/30/2016  . Primary osteoarthritis of both hands 05/30/2016  . High risk medication use 05/30/2016  . Age-related osteoporosis without current pathological fracture 05/30/2016  . Postoperative examination 09/19/2015  . Lipoma of abdominal wall 08/01/2015  . Vaginal atrophy 10/05/2014  . Abnormal vaginal bleeding 05/30/2014  . Idiopathic thrombocytopenic purpura (HCC) 02/24/2011    Past Medical History:  Diagnosis Date  . Abnormal vaginal bleeding 05/30/2014  . Actinic keratosis 10/20/2022  . Age-related osteoporosis without current pathological fracture 05/30/2016   DEXA 12/25/2015 T score -3.3    . Anxiety   . Arthritis   . Blood dyscrasia    low platelets - no longer followed by hematology  . Cancer (HCC)    hx of skin cancers  . Complication of anesthesia   . Depression   . Family history of adverse reaction to anesthesia    son - when young had surgery and woke up and could not move could not break down the chemical and your husband tested positive for same condition son has had surgery since and done ok husband has done okwith surgeries  . Family history of skin cancer 04/23/2021  . GERD (gastroesophageal reflux disease)   . High risk medication use 05/30/2016   Methotrexate     . History of depression 06/06/2016  . History of gastroesophageal reflux (GERD) 06/06/2016  . History of malignant neoplasm of skin 04/23/2021  . History of recurrent UTIs 03/27/2021  . Hyperparathyroidism, primary (HCC) 08/28/2021  . Idiopathic thrombocytopenic purpura (HCC) 02/24/2011  . Insomnia   . Ischemic cerebrovascular disease   . Lipoma of abdominal wall 08/01/2015  .  Osteoporosis   . Polycythemia, secondary 11/14/2020  . PONV (postoperative nausea and vomiting)   . Primary osteoarthritis of both feet 05/30/2016  . Primary osteoarthritis of  both hands 05/30/2016  . Rheumatoid arthritis (HCC)   . Rheumatoid arthritis of multiple sites with negative rheumatoid factor (HCC) 05/30/2016  . Status post parathyroidectomy 09/12/2021  . Thrombocytopenia (HCC)    hx of  . Vaginal atrophy 10/05/2014    Family History  Problem Relation Age of Onset  . Hypertension Mother   . Lung cancer Mother   . Heart disease Father   . Hypertension Brother   . Depression Daughter   . Depression Son    Past Surgical History:  Procedure Laterality Date  . ABDOMINAL HYSTERECTOMY    . BREAST SURGERY Left    lumpectomy   . CHOLECYSTECTOMY    . MOHS SURGERY     x4  . PARATHYROIDECTOMY Right 08/29/2021   Procedure: RIGHT INFERIOR PARATHYROIDECTOMY;  Surgeon: Darnell Level, MD;  Location: [facility];  Service: General;  Laterality: Right;  . TUBAL LIGATION     Social History   Social History Narrative  . Not on file   Immunization History  Administered Date(s) Administered  . Moderna Sars-Covid-2 Vaccination 05/02/2019     Objective: Vital Signs: There were no vitals taken for this visit.   Physical Exam Vitals and nursing note reviewed.  Constitutional:      Appearance: She is well-developed.  HENT:     Head: Normocephalic and atraumatic.  Eyes:     Conjunctiva/sclera: Conjunctivae normal.  Cardiovascular:     Rate and Rhythm: Normal rate and regular rhythm.     Heart sounds: Normal heart sounds.  Pulmonary:     Effort: Pulmonary effort is normal.     Breath sounds: Normal breath sounds.  Abdominal:     General: Bowel sounds are normal.     Palpations: Abdomen is soft.  Musculoskeletal:     Cervical back: Normal range of motion.  Lymphadenopathy:     Cervical: No cervical adenopathy.  Skin:    General: Skin is warm and dry.     Capillary Refill: Capillary refill takes less than 2 seconds.  Neurological:     Mental Status: She is alert and oriented to person, place, and time.  Psychiatric:        Behavior: Behavior normal.      Musculoskeletal Exam: ***  CDAI Exam: CDAI Score: -- Patient Global: --; Provider Global: -- Swollen: --; Tender: -- Joint Exam 10/29/2022   No joint exam has been documented for this visit   There is currently no information documented on the homunculus. Go to the Rheumatology activity and complete the homunculus joint exam.  Investigation: No additional findings.  Imaging: No results found.  Recent Labs: Lab Results  Component Value Date   WBC 7.8 08/12/2021   HGB 15.7 (H) 08/12/2021   PLT 167 08/12/2021   NA 141 08/12/2021   K 4.2 08/12/2021   CL 108 08/12/2021   CO2 27 08/12/2021   GLUCOSE 93 08/12/2021   BUN 16 08/12/2021   CREATININE 0.75 08/12/2021   BILITOT 0.4 03/05/2020   ALKPHOS 90 11/09/2020   AST 31 11/09/2020   ALT 23 11/09/2020   PROT 6.7 03/05/2020   ALBUMIN 3.9 11/09/2020   CALCIUM 10.5 (H) 08/12/2021   GFRAA 79 03/05/2020    Speciality Comments: No specialty comments available.  Procedures:  No procedures performed Allergies: Other and Codeine   Assessment / Plan:  Visit Diagnoses: Rheumatoid arthritis of multiple sites with negative rheumatoid factor (HCC)  High risk medication use  Primary osteoarthritis of both hands  Primary osteoarthritis of both feet  Age-related osteoporosis without current pathological fracture  History of depression  History of gastroesophageal reflux (GERD)  Idiopathic thrombocytopenic purpura (HCC)  Elevated blood pressure reading  Orders: No orders of the defined types were placed in this encounter.  No orders of the defined types were placed in this encounter.   Face-to-face time spent with patient was *** minutes. Greater than 50% of time was spent in counseling and coordination of care.  Follow-Up Instructions: No follow-ups on file.   Gearldine Bienenstock, PA-C  Note - This record has been created using Dragon software.  Chart creation errors have been sought, but may not always   have been located. Such creation errors do not reflect on  the standard of medical care.

## 2022-10-29 ENCOUNTER — Ambulatory Visit: Payer: Medicare Other | Admitting: Physician Assistant

## 2022-10-29 DIAGNOSIS — Z8719 Personal history of other diseases of the digestive system: Secondary | ICD-10-CM

## 2022-10-29 DIAGNOSIS — R030 Elevated blood-pressure reading, without diagnosis of hypertension: Secondary | ICD-10-CM

## 2022-10-29 DIAGNOSIS — Z79899 Other long term (current) drug therapy: Secondary | ICD-10-CM

## 2022-10-29 DIAGNOSIS — M19042 Primary osteoarthritis, left hand: Secondary | ICD-10-CM

## 2022-10-29 DIAGNOSIS — M0609 Rheumatoid arthritis without rheumatoid factor, multiple sites: Secondary | ICD-10-CM

## 2022-10-29 DIAGNOSIS — D693 Immune thrombocytopenic purpura: Secondary | ICD-10-CM

## 2022-10-29 DIAGNOSIS — Z8659 Personal history of other mental and behavioral disorders: Secondary | ICD-10-CM

## 2022-10-29 DIAGNOSIS — M19071 Primary osteoarthritis, right ankle and foot: Secondary | ICD-10-CM

## 2022-10-29 DIAGNOSIS — M810 Age-related osteoporosis without current pathological fracture: Secondary | ICD-10-CM

## 2022-11-01 NOTE — Progress Notes (Deleted)
Office Visit Note  Patient: Joy Fernandez             Date of Birth: 08/06/1945           MRN: [id number]             PCP: Philemon Kingdom, MD Referring: Philemon Kingdom, MD Visit Date: 11/03/2022 Occupation: @GUAROCC @  Subjective:    History of Present Illness: Joy Fernandez is a 76 y.o. female with history of seronegative rheumatoid arthritis and osteoarthritis.  She is not currently taking any immunosuppressive agents.      Activities of Daily Living:  Patient reports morning stiffness for *** {minute/hour:19697}.   Patient {ACTIONS;DENIES/REPORTS:21021675::"Denies"} nocturnal pain.  Difficulty dressing/grooming: {ACTIONS;DENIES/REPORTS:21021675::"Denies"} Difficulty climbing stairs: {ACTIONS;DENIES/REPORTS:21021675::"Denies"} Difficulty getting out of chair: {ACTIONS;DENIES/REPORTS:21021675::"Denies"} Difficulty using hands for taps, buttons, cutlery, and/or writing: {ACTIONS;DENIES/REPORTS:21021675::"Denies"}  No Rheumatology ROS completed.   PMFS History:  Patient Active Problem List   Diagnosis Date Noted   Actinic keratosis 10/20/2022   Osteoporosis    Rheumatoid arthritis (HCC)    Thrombocytopenia (HCC)    PONV (postoperative nausea and vomiting) 07/18/2022   Ischemic cerebrovascular disease 07/18/2022   Insomnia 07/18/2022   GERD (gastroesophageal reflux disease) 07/18/2022   Family history of adverse reaction to anesthesia 07/18/2022   Depression 07/18/2022   Complication of anesthesia 07/18/2022   Cancer (HCC) 07/18/2022   Blood dyscrasia 07/18/2022   Arthritis 07/18/2022   Anxiety 07/18/2022   Status post parathyroidectomy 09/12/2021   Hyperparathyroidism, primary (HCC) 08/28/2021   Family history of skin cancer 04/23/2021   History of malignant neoplasm of skin 04/23/2021   History of recurrent UTIs 03/27/2021   Polycythemia, secondary 11/14/2020   History of depression 06/06/2016   History of gastroesophageal reflux (GERD) 06/06/2016    Rheumatoid arthritis of multiple sites with negative rheumatoid factor (HCC) 05/30/2016   Primary osteoarthritis of both feet 05/30/2016   Primary osteoarthritis of both hands 05/30/2016   High risk medication use 05/30/2016   Age-related osteoporosis without current pathological fracture 05/30/2016   Postoperative examination 09/19/2015   Lipoma of abdominal wall 08/01/2015   Vaginal atrophy 10/05/2014   Abnormal vaginal bleeding 05/30/2014   Idiopathic thrombocytopenic purpura (HCC) 02/24/2011    Past Medical History:  Diagnosis Date   Abnormal vaginal bleeding 05/30/2014   Actinic keratosis 10/20/2022   Age-related osteoporosis without current pathological fracture 05/30/2016   DEXA 12/25/2015 T score -3.3     Anxiety    Arthritis    Blood dyscrasia    low platelets - no longer followed by hematology   Cancer (HCC)    hx of skin cancers   Complication of anesthesia    Depression    Family history of adverse reaction to anesthesia    son - when young had surgery and woke up and could not move could not break down the chemical and your husband tested positive for same condition son has had surgery since and done ok husband has done okwith surgeries   Family history of skin cancer 04/23/2021   GERD (gastroesophageal reflux disease)    High risk medication use 05/30/2016   Methotrexate      History of depression 06/06/2016   History of gastroesophageal reflux (GERD) 06/06/2016   History of malignant neoplasm of skin 04/23/2021   History of recurrent UTIs 03/27/2021   Hyperparathyroidism, primary (HCC) 08/28/2021   Idiopathic thrombocytopenic purpura (HCC) 02/24/2011   Insomnia    Ischemic cerebrovascular disease    Lipoma of abdominal wall 08/01/2015  Osteoporosis    Polycythemia, secondary 11/14/2020   PONV (postoperative nausea and vomiting)    Primary osteoarthritis of both feet 05/30/2016   Primary osteoarthritis of both hands 05/30/2016   Rheumatoid arthritis (HCC)     Rheumatoid arthritis of multiple sites with negative rheumatoid factor (HCC) 05/30/2016   Status post parathyroidectomy 09/12/2021   Thrombocytopenia (HCC)    hx of   Vaginal atrophy 10/05/2014    Family History  Problem Relation Age of Onset   Hypertension Mother    Lung cancer Mother    Heart disease Father    Hypertension Brother    Depression Daughter    Depression Son    Past Surgical History:  Procedure Laterality Date   ABDOMINAL HYSTERECTOMY     BREAST SURGERY Left    lumpectomy    CHOLECYSTECTOMY     MOHS SURGERY     x4   PARATHYROIDECTOMY Right 08/29/2021   Procedure: RIGHT INFERIOR PARATHYROIDECTOMY;  Surgeon: Darnell Level, MD;  Location: [facility];  Service: General;  Laterality: Right;   TUBAL LIGATION     Social History   Social History Narrative   Not on file   Immunization History  Administered Date(s) Administered   Moderna Sars-Covid-2 Vaccination 05/02/2019     Objective: Vital Signs: There were no vitals taken for this visit.   Physical Exam Vitals and nursing note reviewed.  Constitutional:      Appearance: She is well-developed.  HENT:     Head: Normocephalic and atraumatic.  Eyes:     Conjunctiva/sclera: Conjunctivae normal.  Cardiovascular:     Rate and Rhythm: Normal rate and regular rhythm.     Heart sounds: Normal heart sounds.  Pulmonary:     Effort: Pulmonary effort is normal.     Breath sounds: Normal breath sounds.  Abdominal:     General: Bowel sounds are normal.     Palpations: Abdomen is soft.  Musculoskeletal:     Cervical back: Normal range of motion.  Lymphadenopathy:     Cervical: No cervical adenopathy.  Skin:    General: Skin is warm and dry.     Capillary Refill: Capillary refill takes less than 2 seconds.  Neurological:     Mental Status: She is alert and oriented to person, place, and time.  Psychiatric:        Behavior: Behavior normal.      Musculoskeletal Exam: ***  CDAI Exam: CDAI Score: -- Patient  Global: --; Provider Global: -- Swollen: --; Tender: -- Joint Exam 11/03/2022   No joint exam has been documented for this visit   There is currently no information documented on the homunculus. Go to the Rheumatology activity and complete the homunculus joint exam.  Investigation: No additional findings.  Imaging: No results found.  Recent Labs: Lab Results  Component Value Date   WBC 7.8 08/12/2021   HGB 15.7 (H) 08/12/2021   PLT 167 08/12/2021   NA 141 08/12/2021   K 4.2 08/12/2021   CL 108 08/12/2021   CO2 27 08/12/2021   GLUCOSE 93 08/12/2021   BUN 16 08/12/2021   CREATININE 0.75 08/12/2021   BILITOT 0.4 03/05/2020   ALKPHOS 90 11/09/2020   AST 31 11/09/2020   ALT 23 11/09/2020   PROT 6.7 03/05/2020   ALBUMIN 3.9 11/09/2020   CALCIUM 10.5 (H) 08/12/2021   GFRAA 79 03/05/2020    Speciality Comments: No specialty comments available.  Procedures:  No procedures performed Allergies: Other and Codeine   Assessment /  Plan:     Visit Diagnoses: Rheumatoid arthritis of multiple sites with negative rheumatoid factor (HCC)  High risk medication use  Primary osteoarthritis of both hands  Primary osteoarthritis of both feet  Age-related osteoporosis without current pathological fracture  History of depression  History of gastroesophageal reflux (GERD)  Idiopathic thrombocytopenic purpura (HCC)  Orders: No orders of the defined types were placed in this encounter.  No orders of the defined types were placed in this encounter.   Face-to-face time spent with patient was *** minutes. Greater than 50% of time was spent in counseling and coordination of care.  Follow-Up Instructions: No follow-ups on file.   Gearldine Bienenstock, PA-C  Note - This record has been created using Dragon software.  Chart creation errors have been sought, but may not always  have been located. Such creation errors do not reflect on  the standard of medical care.

## 2022-11-03 ENCOUNTER — Ambulatory Visit: Payer: Medicare Other | Admitting: Physician Assistant

## 2022-11-03 DIAGNOSIS — Z79899 Other long term (current) drug therapy: Secondary | ICD-10-CM

## 2022-11-03 DIAGNOSIS — Z8659 Personal history of other mental and behavioral disorders: Secondary | ICD-10-CM

## 2022-11-03 DIAGNOSIS — M810 Age-related osteoporosis without current pathological fracture: Secondary | ICD-10-CM

## 2022-11-03 DIAGNOSIS — D693 Immune thrombocytopenic purpura: Secondary | ICD-10-CM

## 2022-11-03 DIAGNOSIS — M0609 Rheumatoid arthritis without rheumatoid factor, multiple sites: Secondary | ICD-10-CM

## 2022-11-03 DIAGNOSIS — M19071 Primary osteoarthritis, right ankle and foot: Secondary | ICD-10-CM

## 2022-11-03 DIAGNOSIS — M19041 Primary osteoarthritis, right hand: Secondary | ICD-10-CM

## 2022-11-03 DIAGNOSIS — Z8719 Personal history of other diseases of the digestive system: Secondary | ICD-10-CM

## 2022-12-03 DIAGNOSIS — U071 COVID-19: Secondary | ICD-10-CM | POA: Diagnosis not present

## 2022-12-03 DIAGNOSIS — R110 Nausea: Secondary | ICD-10-CM | POA: Diagnosis not present

## 2022-12-08 DIAGNOSIS — Z8639 Personal history of other endocrine, nutritional and metabolic disease: Secondary | ICD-10-CM | POA: Diagnosis not present

## 2022-12-08 DIAGNOSIS — M810 Age-related osteoporosis without current pathological fracture: Secondary | ICD-10-CM | POA: Diagnosis not present

## 2023-02-11 DIAGNOSIS — Z6825 Body mass index (BMI) 25.0-25.9, adult: Secondary | ICD-10-CM | POA: Diagnosis not present

## 2023-02-11 DIAGNOSIS — Z0000 Encounter for general adult medical examination without abnormal findings: Secondary | ICD-10-CM | POA: Diagnosis not present

## 2023-02-11 DIAGNOSIS — G4700 Insomnia, unspecified: Secondary | ICD-10-CM | POA: Diagnosis not present

## 2023-02-11 DIAGNOSIS — Z79899 Other long term (current) drug therapy: Secondary | ICD-10-CM | POA: Diagnosis not present

## 2023-02-11 DIAGNOSIS — E785 Hyperlipidemia, unspecified: Secondary | ICD-10-CM | POA: Diagnosis not present

## 2023-02-11 DIAGNOSIS — F419 Anxiety disorder, unspecified: Secondary | ICD-10-CM | POA: Diagnosis not present

## 2023-02-11 DIAGNOSIS — D751 Secondary polycythemia: Secondary | ICD-10-CM | POA: Diagnosis not present

## 2023-02-11 DIAGNOSIS — K219 Gastro-esophageal reflux disease without esophagitis: Secondary | ICD-10-CM | POA: Diagnosis not present

## 2023-02-11 DIAGNOSIS — Z1331 Encounter for screening for depression: Secondary | ICD-10-CM | POA: Diagnosis not present

## 2023-02-25 ENCOUNTER — Encounter (HOSPITAL_BASED_OUTPATIENT_CLINIC_OR_DEPARTMENT_OTHER): Payer: Self-pay | Admitting: Emergency Medicine

## 2023-02-25 ENCOUNTER — Ambulatory Visit (HOSPITAL_BASED_OUTPATIENT_CLINIC_OR_DEPARTMENT_OTHER)
Admission: EM | Admit: 2023-02-25 | Discharge: 2023-02-25 | Disposition: A | Discharge status: Home / Self Care | Payer: Medicare Other | Attending: Internal Medicine | Admitting: Internal Medicine

## 2023-02-25 DIAGNOSIS — H8113 Benign paroxysmal vertigo, bilateral: Secondary | ICD-10-CM

## 2023-02-25 MED ORDER — MECLIZINE HCL 12.5 MG PO TABS: 12.5000 mg | ORAL_TABLET | Freq: Three times a day (TID) | ORAL | 0 refills | Status: DC | PRN

## 2023-02-25 NOTE — ED Provider Notes (Signed)
[facility]    CSN: [id number] Arrival date & time: 02/25/23  1102      History   Chief Complaint Chief Complaint  Patient presents with   Dizziness    HPI Joy Fernandez is a 77 y.o. female.   Joy Fernandez is a 77 y.o. female presenting for chief complaint of dizziness that started this morning while she was sitting down. She ate some breakfast and attempted to get up and fold laundry but dizziness persisted. Symptoms are triggered by head movement. Denies dizziness with position changes. She had one episode of nausea with vomiting this morning after eating breakfast. Emesis non-bloody/non-bilious. Denies associated headache, vision changes, extremity weakness, paresthesias, neck pain, speech changes, recent head injury/trauma, tinnitus, heart palpitations, chest pain, shortness of breath, diarrhea, abdominal pain, rash, and persistent nausea.  No urinary symptoms. Describes dizziness sensation as "swimmy-headedness". History of vertigo, states this feels similar. Daughter at bedside contributing to history. She has not attempted use of any OTC medicines to help with symptoms PTA.   Additionally reports increased stress related to providing care for her husband.   The history is provided by the patient and a relative. No language interpreter was used.  Dizziness   Past Medical History:  Diagnosis Date   Abnormal vaginal bleeding 05/30/2014   Actinic keratosis 10/20/2022   Age-related osteoporosis without current pathological fracture 05/30/2016   DEXA 12/25/2015 T score -3.3     Anxiety    Arthritis    Blood dyscrasia    low platelets - no longer followed by hematology   Cancer (HCC)    hx of skin cancers   Complication of anesthesia    Depression    Family history of adverse reaction to anesthesia    son - when young had surgery and woke up and could not move could not break down the chemical and your husband tested positive for same condition son has had surgery  since and done ok husband has done okwith surgeries   Family history of skin cancer 04/23/2021   GERD (gastroesophageal reflux disease)    High risk medication use 05/30/2016   Methotrexate      History of depression 06/06/2016   History of gastroesophageal reflux (GERD) 06/06/2016   History of malignant neoplasm of skin 04/23/2021   History of recurrent UTIs 03/27/2021   Hyperparathyroidism, primary (HCC) 08/28/2021   Idiopathic thrombocytopenic purpura (HCC) 02/24/2011   Insomnia    Ischemic cerebrovascular disease    Lipoma of abdominal wall 08/01/2015   Osteoporosis    Polycythemia, secondary 11/14/2020   PONV (postoperative nausea and vomiting)    Primary osteoarthritis of both feet 05/30/2016   Primary osteoarthritis of both hands 05/30/2016   Rheumatoid arthritis (HCC)    Rheumatoid arthritis of multiple sites with negative rheumatoid factor (HCC) 05/30/2016   Status post parathyroidectomy 09/12/2021   Thrombocytopenia (HCC)    hx of   Vaginal atrophy 10/05/2014    Patient Active Problem List   Diagnosis Date Noted   Actinic keratosis 10/20/2022   Osteoporosis    Rheumatoid arthritis (HCC)    Thrombocytopenia (HCC)    PONV (postoperative nausea and vomiting) 07/18/2022   Ischemic cerebrovascular disease 07/18/2022   Insomnia 07/18/2022   GERD (gastroesophageal reflux disease) 07/18/2022   Family history of adverse reaction to anesthesia 07/18/2022   Depression 07/18/2022   Complication of anesthesia 07/18/2022   Cancer (HCC) 07/18/2022   Blood dyscrasia 07/18/2022   Arthritis 07/18/2022   Anxiety 07/18/2022  Status post parathyroidectomy 09/12/2021   Hyperparathyroidism, primary (HCC) 08/28/2021   Family history of skin cancer 04/23/2021   History of malignant neoplasm of skin 04/23/2021   History of recurrent UTIs 03/27/2021   Polycythemia, secondary 11/14/2020   History of depression 06/06/2016   History of gastroesophageal reflux (GERD) 06/06/2016    Rheumatoid arthritis of multiple sites with negative rheumatoid factor (HCC) 05/30/2016   Primary osteoarthritis of both feet 05/30/2016   Primary osteoarthritis of both hands 05/30/2016   High risk medication use 05/30/2016   Age-related osteoporosis without current pathological fracture 05/30/2016   Postoperative examination 09/19/2015   Lipoma of abdominal wall 08/01/2015   Vaginal atrophy 10/05/2014   Abnormal vaginal bleeding 05/30/2014   Idiopathic thrombocytopenic purpura (HCC) 02/24/2011    Past Surgical History:  Procedure Laterality Date   ABDOMINAL HYSTERECTOMY     BREAST SURGERY Left    lumpectomy    CHOLECYSTECTOMY     MOHS SURGERY     x4   PARATHYROIDECTOMY Right 08/29/2021   Procedure: RIGHT INFERIOR PARATHYROIDECTOMY;  Surgeon: Darnell Level, MD;  Location: [facility];  Service: General;  Laterality: Right;   TUBAL LIGATION      OB History   No obstetric history on file.      Home Medications    Prior to Admission medications   Medication Sig Start Date End Date Taking? Authorizing Provider  meclizine (ANTIVERT) 12.5 MG tablet Take 1 tablet (12.5 mg total) by mouth 3 (three) times daily as needed for dizziness. 02/25/23  Yes Carlisle Beers, FNP  acetaminophen (TYLENOL) 500 MG tablet Take 500 mg by mouth every 8 (eight) hours as needed for moderate pain.    [provider]  ALPRAZolam Prudy Feeler) 0.25 MG tablet Take 0.125-0.25 mg by mouth 2 (two) times daily as needed for anxiety. 06/29/19   [provider]  aspirin EC 81 MG tablet Take 81 mg by mouth 2 (two) times a week. Swallow whole.    [provider]  Carboxymethylcell-Glycerin PF (REFRESH RELIEVA PF) 0.5-1 % SOLN Place 1 drop into both eyes daily as needed (dry eyes).    [provider]  Cholecalciferol (VITAMIN D) 50 MCG (2000 UT) tablet Take 2,000 Units by mouth 3 (three) times a week.    [provider]  Coenzyme Q10 (COQ10) 100 MG CAPS Take 100 mg by mouth  daily.    [provider]  estradiol (ESTRACE) 0.5 MG tablet Take 0.5 mg by mouth daily. 07/14/22   [provider]  L-Lysine 500 MG CAPS Take 500 mg by mouth 2 (two) times a week.    [provider]  pantoprazole (PROTONIX) 40 MG tablet Take 40 mg by mouth daily as needed (acid reflux). 01/13/19   [provider]  rosuvastatin (CRESTOR) 5 MG tablet Take 5 mg by mouth daily. 07/31/21   [provider]  VITAMIN A PO Take 1 tablet by mouth every other day.    [provider]  Wheat Dextrin (BENEFIBER) POWD Take 2 Scoops by mouth daily.    [provider]  zolpidem (AMBIEN) 10 MG tablet Take 5-10 mg by mouth at bedtime. 06/29/19   [provider]    Family History Family History  Problem Relation Age of Onset   Hypertension Mother    Lung cancer Mother    Heart disease Father    Hypertension Brother    Depression Daughter    Depression Son     Social History Social History  Tobacco Use   Smoking status: Never    Passive exposure: Past   Smokeless tobacco: Never  Vaping Use   Vaping status: Never Used  Substance Use Topics   Alcohol use: No   Drug use: No     Allergies   Other and Codeine   Review of Systems Review of Systems  Neurological:  Positive for dizziness.  Per HPI   Physical Exam Triage Vital Signs ED Triage Vitals  Encounter Vitals Group     BP 02/25/23 1118 (!) 161/82     Systolic BP Percentile --      Diastolic BP Percentile --      Pulse Rate 02/25/23 1118 65     Resp 02/25/23 1118 18     Temp 02/25/23 1118 97.7 F (36.5 C)     Temp Source 02/25/23 1118 Oral     SpO2 02/25/23 1118 97 %     Weight --      Height --      Head Circumference --      Peak Flow --      Pain Score 02/25/23 1116 0     Pain Loc --      Pain Education --      Exclude from Growth Chart --    No data found.  Updated Vital Signs BP (!) 161/82 (BP Location: Right Arm)   Pulse 65   Temp 97.7 F  (36.5 C) (Oral)   Resp 18   SpO2 97%   Visual Acuity Right Eye Distance:   Left Eye Distance:   Bilateral Distance:    Right Eye Near:   Left Eye Near:    Bilateral Near:     Physical Exam Vitals and nursing note reviewed.  Constitutional:      Appearance: She is not ill-appearing or toxic-appearing.  HENT:     Head: Normocephalic and atraumatic.     Right Ear: Hearing, tympanic membrane, ear canal and external ear normal.     Left Ear: Hearing, tympanic membrane, ear canal and external ear normal.     Nose: Nose normal.     Mouth/Throat:     Lips: Pink.     Mouth: Mucous membranes are moist. No injury.     Tongue: No lesions. Tongue does not deviate from midline.     Palate: No mass and lesions.     Pharynx: Oropharynx is clear. Uvula midline. No pharyngeal swelling, oropharyngeal exudate, posterior oropharyngeal erythema or uvula swelling.     Tonsils: No tonsillar exudate or tonsillar abscesses.  Eyes:     General: Lids are normal. Vision grossly intact. Gaze aligned appropriately. No visual field deficit.    Extraocular Movements: Extraocular movements intact.     Conjunctiva/sclera: Conjunctivae normal.  Cardiovascular:     Rate and Rhythm: Normal rate and regular rhythm.     Heart sounds: Normal heart sounds, S1 normal and S2 normal.  Pulmonary:     Effort: Pulmonary effort is normal. No respiratory distress.     Breath sounds: Normal breath sounds and air entry.  Musculoskeletal:     Cervical back: Neck supple.     Right lower leg: No edema.     Left lower leg: No edema.  Skin:    General: Skin is warm and dry.     Capillary Refill: Capillary refill takes less than 2 seconds.     Findings: No rash.  Neurological:     General: No focal deficit present.  Mental Status: She is alert and oriented to person, place, and time. Mental status is at baseline.     Cranial Nerves: Cranial nerves 2-12 are intact. No cranial nerve deficit, dysarthria or facial  asymmetry.     Sensory: Sensation is intact.     Motor: Motor function is intact. No weakness, abnormal muscle tone or pronator drift.     Coordination: Coordination is intact. Romberg sign negative. Coordination normal. Finger-Nose-Finger Test and Heel to [facility] Test normal.     Gait: Gait is intact. Gait normal.     Comments: Strength and sensation intact to bilateral upper and lower extremities (5/5). Moves all 4 extremities with normal coordination voluntarily. Non-focal neuro exam. Gait steady without assistance. Romberg negative.   Psychiatric:        Mood and Affect: Mood normal.        Speech: Speech normal.        Behavior: Behavior normal.        Thought Content: Thought content normal.        Judgment: Judgment normal.      UC Treatments / Results  Labs (all labs ordered are listed, but only abnormal results are displayed) Labs Reviewed - No data to display  EKG   Radiology No results found.  Procedures Procedures (including critical care time)  Medications Ordered in UC Medications - No data to display  Initial Impression / Assessment and Plan / UC Course  I have reviewed the triage vital signs and the nursing notes.  Pertinent labs & imaging results that were available during my care of the patient were reviewed by me and considered in my medical decision making (see chart for details).   1. Benign paroxysmal positional vertigo Presentation suspicious for BPV. Symptoms triggered with head movement on exam.  EKG shows normal sinus rhythm without ST/T wave changes, similar to previous EKG on 10/22/22.  Low suspicion for cardiac etiology of dizziness. She is neurologically intact to baseline without focal deficit.  Suspect stress reaction and mild dehydration may be contributing.  Advised to increase fluid intake to maintain hydration and rest. Epley maneuvers discussed. Meclizine 12.5mg  every 8 hours as needed for dizziness, drowsiness precautions discussed. PCP  follow-up in 2-3 days as needed, ER precautions discussed.  Counseled patient on potential for adverse effects with medications prescribed/recommended today, strict ER and return-to-clinic precautions discussed, patient verbalized understanding.    Final Clinical Impressions(s) / UC Diagnoses   Final diagnoses:  Benign paroxysmal positional vertigo due to bilateral vestibular disorder     Discharge Instructions      Take meclizine every 8 hours as needed for dizziness. This medicine will make you drowsy.  Attempt to relax today.   Drink plenty of fluids to stay well-hydrated.  If you develop sudden worsening dizziness, vision changes, severe headache, persistent nausea/vomiting, fevers, neck pain, then I need you to go to the nearest emergency department for further evaluation. Otherwise, follow-up with your PCP in the next 3 to 5 days.       ED Prescriptions     Medication Sig Dispense Auth. Provider   meclizine (ANTIVERT) 12.5 MG tablet Take 1 tablet (12.5 mg total) by mouth 3 (three) times daily as needed for dizziness. 30 tablet Carlisle Beers, FNP      PDMP not reviewed this encounter.   Carlisle Beers, [place] 03/02/23 1014

## 2023-02-25 NOTE — ED Triage Notes (Signed)
Woke up around 8am and felt having vertigo. Ate breakfast and then sat down. Tried doing laundry and happened again then did vomit. Hasn't taken medications for symptoms.

## 2023-02-25 NOTE — Discharge Instructions (Signed)
Take meclizine every 8 hours as needed for dizziness. This medicine will make you drowsy.  Attempt to relax today.   Drink plenty of fluids to stay well-hydrated.  If you develop sudden worsening dizziness, vision changes, severe headache, persistent nausea/vomiting, fevers, neck pain, then I need you to go to the nearest emergency department for further evaluation. Otherwise, follow-up with your PCP in the next 3 to 5 days.

## 2023-05-22 DIAGNOSIS — D485 Neoplasm of uncertain behavior of skin: Secondary | ICD-10-CM | POA: Diagnosis not present

## 2023-05-22 DIAGNOSIS — Z85828 Personal history of other malignant neoplasm of skin: Secondary | ICD-10-CM | POA: Diagnosis not present

## 2023-05-22 DIAGNOSIS — D2222 Melanocytic nevi of left ear and external auricular canal: Secondary | ICD-10-CM | POA: Diagnosis not present

## 2023-06-01 DIAGNOSIS — R29890 Loss of height: Secondary | ICD-10-CM | POA: Diagnosis not present

## 2023-06-01 DIAGNOSIS — M8588 Other specified disorders of bone density and structure, other site: Secondary | ICD-10-CM | POA: Diagnosis not present

## 2023-06-01 DIAGNOSIS — M069 Rheumatoid arthritis, unspecified: Secondary | ICD-10-CM | POA: Diagnosis not present

## 2023-06-22 DIAGNOSIS — R6889 Other general symptoms and signs: Secondary | ICD-10-CM | POA: Diagnosis not present

## 2023-06-22 DIAGNOSIS — K219 Gastro-esophageal reflux disease without esophagitis: Secondary | ICD-10-CM | POA: Diagnosis not present

## 2023-06-22 DIAGNOSIS — Z8639 Personal history of other endocrine, nutritional and metabolic disease: Secondary | ICD-10-CM | POA: Diagnosis not present

## 2023-06-22 DIAGNOSIS — M85852 Other specified disorders of bone density and structure, left thigh: Secondary | ICD-10-CM | POA: Diagnosis not present

## 2023-08-07 DIAGNOSIS — T1501XA Foreign body in cornea, right eye, initial encounter: Secondary | ICD-10-CM | POA: Diagnosis not present

## 2023-08-10 DIAGNOSIS — E785 Hyperlipidemia, unspecified: Secondary | ICD-10-CM | POA: Diagnosis not present

## 2023-08-10 DIAGNOSIS — F419 Anxiety disorder, unspecified: Secondary | ICD-10-CM | POA: Diagnosis not present

## 2023-08-10 DIAGNOSIS — Z79899 Other long term (current) drug therapy: Secondary | ICD-10-CM | POA: Diagnosis not present

## 2023-08-10 DIAGNOSIS — G4700 Insomnia, unspecified: Secondary | ICD-10-CM | POA: Diagnosis not present

## 2023-08-10 DIAGNOSIS — R079 Chest pain, unspecified: Secondary | ICD-10-CM | POA: Diagnosis not present

## 2023-08-13 DIAGNOSIS — T1501XD Foreign body in cornea, right eye, subsequent encounter: Secondary | ICD-10-CM | POA: Diagnosis not present

## 2023-08-14 DIAGNOSIS — L821 Other seborrheic keratosis: Secondary | ICD-10-CM | POA: Diagnosis not present

## 2023-08-14 DIAGNOSIS — D485 Neoplasm of uncertain behavior of skin: Secondary | ICD-10-CM | POA: Diagnosis not present

## 2023-08-14 DIAGNOSIS — L720 Epidermal cyst: Secondary | ICD-10-CM | POA: Diagnosis not present

## 2023-08-14 DIAGNOSIS — D225 Melanocytic nevi of trunk: Secondary | ICD-10-CM | POA: Diagnosis not present

## 2023-08-14 DIAGNOSIS — L570 Actinic keratosis: Secondary | ICD-10-CM | POA: Diagnosis not present

## 2023-08-14 DIAGNOSIS — L578 Other skin changes due to chronic exposure to nonionizing radiation: Secondary | ICD-10-CM | POA: Diagnosis not present

## 2023-08-29 ENCOUNTER — Ambulatory Visit (HOSPITAL_BASED_OUTPATIENT_CLINIC_OR_DEPARTMENT_OTHER): Admission: EM | Admit: 2023-08-29 | Discharge: 2023-08-29 | Disposition: A | Discharge status: Home / Self Care

## 2023-08-29 ENCOUNTER — Encounter (HOSPITAL_BASED_OUTPATIENT_CLINIC_OR_DEPARTMENT_OTHER): Payer: Self-pay | Admitting: Emergency Medicine

## 2023-08-29 DIAGNOSIS — K116 Mucocele of salivary gland: Secondary | ICD-10-CM

## 2023-08-29 NOTE — ED Provider Notes (Signed)
 [facility]    CSN: [id number] Arrival date & time: 08/29/23  4401      History   Chief Complaint No chief complaint on file.   HPI Joy [facility]andez is a 78 y.o. female.   The patient's husband has been in the hospital for several weeks and she has been visiting him daily.  On Thursday, 08/27/2023, she found 2 bumps under her tongue.  They are not tender.  They are hard.  She really does not know when they first occurred.     Past Medical History:  Diagnosis Date   Abnormal vaginal bleeding 05/30/2014   Actinic keratosis 10/20/2022   Age-related osteoporosis without current pathological fracture 05/30/2016   DEXA 12/25/2015 T score -3.3     Anxiety    Arthritis    Blood dyscrasia    low platelets - no longer followed by hematology   Cancer (HCC)    hx of skin cancers   Complication of anesthesia    Depression    Family history of adverse reaction to anesthesia    son - when young had surgery and woke up and could not move could not break down the chemical and your husband tested positive for same condition son has had surgery since and done ok husband has done okwith surgeries   Family history of skin cancer 04/23/2021   GERD (gastroesophageal reflux disease)    High risk medication use 05/30/2016   Methotrexate       History of depression 06/06/2016   History of gastroesophageal reflux (GERD) 06/06/2016   History of malignant neoplasm of skin 04/23/2021   History of recurrent UTIs 03/27/2021   Hyperparathyroidism, primary (HCC) 08/28/2021   Idiopathic thrombocytopenic purpura (HCC) 02/24/2011   Insomnia    Ischemic cerebrovascular disease    Lipoma of abdominal wall 08/01/2015   Osteoporosis    Polycythemia, secondary 11/14/2020   PONV (postoperative nausea and vomiting)    Primary osteoarthritis of both feet 05/30/2016   Primary osteoarthritis of both hands 05/30/2016   Rheumatoid arthritis (HCC)    Rheumatoid arthritis of multiple sites with negative  rheumatoid factor (HCC) 05/30/2016   Status post parathyroidectomy 09/12/2021   Thrombocytopenia (HCC)    hx of   Vaginal atrophy 10/05/2014    Patient Active Problem List   Diagnosis Date Noted   Actinic keratosis 10/20/2022   Osteoporosis    Rheumatoid arthritis (HCC)    Thrombocytopenia (HCC)    PONV (postoperative nausea and vomiting) 07/18/2022   Ischemic cerebrovascular disease 07/18/2022   Insomnia 07/18/2022   GERD (gastroesophageal reflux disease) 07/18/2022   Family history of adverse reaction to anesthesia 07/18/2022   Depression 07/18/2022   Complication of anesthesia 07/18/2022   Cancer (HCC) 07/18/2022   Blood dyscrasia 07/18/2022   Arthritis 07/18/2022   Anxiety 07/18/2022   Status post parathyroidectomy 09/12/2021   Hyperparathyroidism, primary (HCC) 08/28/2021   Family history of skin cancer 04/23/2021   History of malignant neoplasm of skin 04/23/2021   History of recurrent UTIs 03/27/2021   Polycythemia, secondary 11/14/2020   History of depression 06/06/2016   History of gastroesophageal reflux (GERD) 06/06/2016   Rheumatoid arthritis of multiple sites with negative rheumatoid factor (HCC) 05/30/2016   Primary osteoarthritis of both feet 05/30/2016   Primary osteoarthritis of both hands 05/30/2016   High risk medication use 05/30/2016   Age-related osteoporosis without current pathological fracture 05/30/2016   Postoperative examination 09/19/2015   Lipoma of abdominal wall 08/01/2015   Vaginal atrophy 10/05/2014  Abnormal vaginal bleeding 05/30/2014   Idiopathic thrombocytopenic purpura (HCC) 02/24/2011    Past Surgical History:  Procedure Laterality Date   ABDOMINAL HYSTERECTOMY     BREAST SURGERY Left    lumpectomy    CHOLECYSTECTOMY     MOHS SURGERY     x4   PARATHYROIDECTOMY Right 08/29/2021   Procedure: RIGHT INFERIOR PARATHYROIDECTOMY;  Surgeon: Oralee Billow, MD;  Location: [facility];  Service: General;  Laterality: Right;   TUBAL LIGATION       OB History   No obstetric history on file.      Home Medications    Prior to Admission medications   Medication Sig Start Date End Date Taking? Authorizing Provider  ALPRAZolam (XANAX) 0.25 MG tablet Take 0.125-0.25 mg by mouth 2 (two) times daily as needed for anxiety. 06/29/19  Yes [provider]  estradiol (ESTRACE) 0.5 MG tablet Take 0.5 mg by mouth daily. 07/14/22  Yes [provider]  zolpidem (AMBIEN) 10 MG tablet Take 5-10 mg by mouth at bedtime. 06/29/19  Yes [provider]  acetaminophen  (TYLENOL ) 500 MG tablet Take 500 mg by mouth every 8 (eight) hours as needed for moderate pain.    [provider]  aspirin EC 81 MG tablet Take 81 mg by mouth 2 (two) times a week. Swallow whole.    [provider]  Carboxymethylcell-Glycerin PF (REFRESH RELIEVA PF) 0.5-1 % SOLN Place 1 drop into both eyes daily as needed (dry eyes).    [provider]  Cholecalciferol (VITAMIN D ) 50 MCG (2000 UT) tablet Take 2,000 Units by mouth 3 (three) times a week.    [provider]  Coenzyme Q10 (COQ10) 100 MG CAPS Take 100 mg by mouth daily.    [provider]  L-Lysine 500 MG CAPS Take 500 mg by mouth 2 (two) times a week.    [provider]  meclizine  (ANTIVERT ) 12.5 MG tablet Take 1 tablet (12.5 mg total) by mouth 3 (three) times daily as needed for dizziness. 02/25/23   Starlene Eaton, FNP  pantoprazole (PROTONIX) 40 MG tablet Take 40 mg by mouth daily as needed (acid reflux). 01/13/19   [provider]  rosuvastatin (CRESTOR) 5 MG tablet Take 5 mg by mouth daily. 07/31/21   [provider]  VITAMIN A PO Take 1 tablet by mouth every other day.    [provider]  Wheat Dextrin (BENEFIBER) POWD Take 2 Scoops by mouth daily.    [provider]    Family History Family History  Problem Relation Age of Onset   Hypertension Mother    Lung cancer Mother    Heart disease Father     Hypertension Brother    Depression Daughter    Depression Son     Social History Social History   Tobacco Use   Smoking status: Never    Passive exposure: Past   Smokeless tobacco: Never  Vaping Use   Vaping status: Never Used  Substance Use Topics   Alcohol  use: No   Drug use: No     Allergies   Other and Codeine   Review of Systems Review of Systems  Constitutional:  Negative for fever.  HENT:  Positive for dental problem.   Respiratory:  Negative for cough.   Cardiovascular:  Negative for chest pain.  Gastrointestinal:  Negative for abdominal pain, constipation, diarrhea, nausea and vomiting.  Musculoskeletal:  Negative for arthralgias and back pain.  Skin:  Negative for color change and rash.  Neurological:  Negative for syncope.  All other systems reviewed and are negative.    Physical Exam Triage Vital Signs ED Triage Vitals  Encounter Vitals Group     BP 08/29/23 1012 124/68     Systolic BP Percentile --      Diastolic BP Percentile --      Pulse Rate 08/29/23 1012 65     Resp 08/29/23 1012 18     Temp 08/29/23 1012 (!) 97.4 F (36.3 C)     Temp Source 08/29/23 1012 Oral     SpO2 08/29/23 1012 95 %     Weight --      Height --      Head Circumference --      Peak Flow --      Pain Score 08/29/23 1011 0     Pain Loc --      Pain Education --      Exclude from Growth Chart --    No data found.  Updated Vital Signs BP 124/68 (BP Location: Right Arm)   Pulse 65   Temp (!) 97.4 F (36.3 C) (Oral)   Resp 18   SpO2 95%   Visual Acuity Right Eye Distance:   Left Eye Distance:   Bilateral Distance:    Right Eye Near:   Left Eye Near:    Bilateral Near:     Physical Exam Vitals and nursing note reviewed.  Constitutional:      General: She is not in acute distress.    Appearance: She is well-developed. She is not ill-appearing or toxic-appearing.  HENT:     Head: Normocephalic and atraumatic.     Right Ear: Hearing, tympanic  membrane, ear canal and external ear normal.     Left Ear: Hearing, tympanic membrane, ear canal and external ear normal.     Nose: No congestion or rhinorrhea.     Right Sinus: No maxillary sinus tenderness or frontal sinus tenderness.     Left Sinus: No maxillary sinus tenderness or frontal sinus tenderness.     Mouth/Throat:     Lips: Pink.     Mouth: Mucous membranes are moist. Oral lesions (3 nodules noted in the cavity under her tongue.   are approximately 8 mm and one is 2 mm in size.  See picture for more information.) present.     Pharynx: Uvula midline. No oropharyngeal exudate or posterior oropharyngeal erythema.     Tonsils: No tonsillar exudate.  Eyes:     Conjunctiva/sclera: Conjunctivae normal.     Pupils: Pupils are equal, round, and reactive to light.  Cardiovascular:     Rate and Rhythm: Normal rate and regular rhythm.     Heart sounds: S1 normal and S2 normal. No murmur heard. Pulmonary:     Effort: Pulmonary effort is normal. No respiratory distress.     Breath sounds: Normal breath sounds. No decreased breath sounds, wheezing, rhonchi or rales.  Abdominal:     General: Bowel sounds are normal.     Palpations: Abdomen is soft.     Tenderness: There is no abdominal tenderness.  Musculoskeletal:        General: No swelling.     Cervical back: Neck supple.  Lymphadenopathy:     Head:     Right side of head: No submental, submandibular, tonsillar, preauricular or posterior auricular adenopathy.     Left side of head: No submental, submandibular, tonsillar, preauricular or posterior auricular adenopathy.     Cervical: No cervical  adenopathy.     Right cervical: No superficial cervical adenopathy.    Left cervical: No superficial cervical adenopathy.  Skin:    General: Skin is warm and dry.     Capillary Refill: Capillary refill takes less than 2 seconds.     Findings: No rash.  Neurological:     Mental Status: She is alert and oriented to person, place, and time.   Psychiatric:        Mood and Affect: Mood normal.      UC Treatments / Results  Labs (all labs ordered are listed, but only abnormal results are displayed) Labs Reviewed - No data to display  EKG   Radiology No results found.  Procedures Procedures (including critical care time)  Medications Ordered in UC Medications - No data to display  Initial Impression / Assessment and Plan / UC Course  I have reviewed the triage vital signs and the nursing notes.  Pertinent labs & imaging results that were available during my care of the patient were reviewed by me and considered in my medical decision making (see chart for details).  Plan of Care: Patient has hard oral lesions that I believe could be ranula's or mucoceles.  However I cannot rule out other oral lesions nor cannot rule out oral cancer.  The lesions are hard not soft but they are not tender and not inflamed.  No antibiotics provided.  Encouraged salt water gargles 2-4 times daily.  Encouraged to connect with oral surgery for further evaluation and possible biopsy or surgical removal.  Follow-up here as needed.  I reviewed the plan of care with the patient and/or the patient's guardian.  The patient and/or guardian had time to ask questions and acknowledged that the questions were answered.  I provided instruction on symptoms or reasons to return here or to go to an ER, if symptoms/condition did not improve, worsened or if new symptoms occurred.  Final Clinical Impressions(s) / UC Diagnoses   Final diagnoses:  Ranula of floor of mouth     Discharge Instructions      Lesions under her tongue are either ranula's or mucoceles or could possibly be other oral lesions even including possibly oral cancer.  The lesions are hard not soft and are not tender.  Antibiotics not needed at this time.  Encouraged salt water gargles.  Needs to see oral surgery for further management.   ED Prescriptions   None    PDMP not reviewed  this encounter.   Guss Legacy, FNP 08/29/23 1115

## 2023-08-29 NOTE — Discharge Instructions (Signed)
 Lesions under her tongue are either ranula's or mucoceles or could possibly be other oral lesions even including possibly oral cancer.  The lesions are hard not soft and are not tender.  Antibiotics not needed at this time.  Encouraged salt water gargles.  Needs to see oral surgery for further management.

## 2023-08-29 NOTE — ED Triage Notes (Signed)
 Pt has two bumps under her tongue she first noticed on Thursday. Pt reports they are hard knots.

## 2023-09-09 DIAGNOSIS — H43813 Vitreous degeneration, bilateral: Secondary | ICD-10-CM | POA: Diagnosis not present

## 2023-10-14 DIAGNOSIS — Z1231 Encounter for screening mammogram for malignant neoplasm of breast: Secondary | ICD-10-CM | POA: Diagnosis not present

## 2023-10-20 DIAGNOSIS — D485 Neoplasm of uncertain behavior of skin: Secondary | ICD-10-CM | POA: Diagnosis not present

## 2023-10-20 DIAGNOSIS — L820 Inflamed seborrheic keratosis: Secondary | ICD-10-CM | POA: Diagnosis not present

## 2023-12-08 DIAGNOSIS — K219 Gastro-esophageal reflux disease without esophagitis: Secondary | ICD-10-CM | POA: Diagnosis not present

## 2023-12-08 DIAGNOSIS — Z8639 Personal history of other endocrine, nutritional and metabolic disease: Secondary | ICD-10-CM | POA: Diagnosis not present

## 2023-12-08 DIAGNOSIS — M85852 Other specified disorders of bone density and structure, left thigh: Secondary | ICD-10-CM | POA: Diagnosis not present

## 2024-01-19 DIAGNOSIS — E785 Hyperlipidemia, unspecified: Secondary | ICD-10-CM | POA: Diagnosis not present

## 2024-01-19 DIAGNOSIS — Z79899 Other long term (current) drug therapy: Secondary | ICD-10-CM | POA: Diagnosis not present

## 2024-01-19 DIAGNOSIS — E7800 Pure hypercholesterolemia, unspecified: Secondary | ICD-10-CM | POA: Diagnosis not present

## 2024-01-19 DIAGNOSIS — M069 Rheumatoid arthritis, unspecified: Secondary | ICD-10-CM | POA: Diagnosis not present

## 2024-01-19 DIAGNOSIS — I444 Left anterior fascicular block: Secondary | ICD-10-CM | POA: Diagnosis not present

## 2024-01-19 DIAGNOSIS — Z7982 Long term (current) use of aspirin: Secondary | ICD-10-CM | POA: Diagnosis not present

## 2024-01-19 DIAGNOSIS — F32A Depression, unspecified: Secondary | ICD-10-CM | POA: Diagnosis not present

## 2024-01-19 DIAGNOSIS — I440 Atrioventricular block, first degree: Secondary | ICD-10-CM | POA: Diagnosis not present

## 2024-01-19 DIAGNOSIS — Z885 Allergy status to narcotic agent status: Secondary | ICD-10-CM | POA: Diagnosis not present

## 2024-01-19 DIAGNOSIS — F419 Anxiety disorder, unspecified: Secondary | ICD-10-CM | POA: Diagnosis not present

## 2024-01-19 DIAGNOSIS — K279 Peptic ulcer, site unspecified, unspecified as acute or chronic, without hemorrhage or perforation: Secondary | ICD-10-CM | POA: Diagnosis not present

## 2024-01-19 DIAGNOSIS — M1990 Unspecified osteoarthritis, unspecified site: Secondary | ICD-10-CM | POA: Diagnosis not present

## 2024-01-19 DIAGNOSIS — R9439 Abnormal result of other cardiovascular function study: Secondary | ICD-10-CM | POA: Diagnosis not present

## 2024-01-19 DIAGNOSIS — R079 Chest pain, unspecified: Secondary | ICD-10-CM | POA: Diagnosis not present

## 2024-01-19 DIAGNOSIS — G4700 Insomnia, unspecified: Secondary | ICD-10-CM | POA: Diagnosis not present

## 2024-01-19 DIAGNOSIS — K219 Gastro-esophageal reflux disease without esophagitis: Secondary | ICD-10-CM | POA: Diagnosis not present

## 2024-01-19 DIAGNOSIS — I517 Cardiomegaly: Secondary | ICD-10-CM | POA: Diagnosis not present

## 2024-01-19 DIAGNOSIS — R9431 Abnormal electrocardiogram [ECG] [EKG]: Secondary | ICD-10-CM | POA: Diagnosis not present

## 2024-01-20 DIAGNOSIS — R079 Chest pain, unspecified: Secondary | ICD-10-CM | POA: Diagnosis not present

## 2024-01-20 DIAGNOSIS — E785 Hyperlipidemia, unspecified: Secondary | ICD-10-CM | POA: Diagnosis not present

## 2024-01-20 DIAGNOSIS — K219 Gastro-esophageal reflux disease without esophagitis: Secondary | ICD-10-CM | POA: Diagnosis not present

## 2024-01-21 ENCOUNTER — Ambulatory Visit (HOSPITAL_COMMUNITY)
Admission: RE | Admit: 2024-01-21 | Discharge: 2024-01-21 | Disposition: A | Discharge status: Home / Self Care | Attending: Cardiovascular Disease | Admitting: Cardiovascular Disease

## 2024-01-21 ENCOUNTER — Encounter (HOSPITAL_COMMUNITY)
Admission: RE | Disposition: A | Discharge status: Home / Self Care | Payer: Self-pay | Source: Home / Self Care | Attending: Cardiovascular Disease

## 2024-01-21 ENCOUNTER — Ambulatory Visit (HOSPITAL_COMMUNITY)
Admission: EM | Admit: 2024-01-21 | Discharge: 2024-01-21 | Disposition: A | Discharge status: Home / Self Care | Source: Ambulatory Visit | Attending: Internal Medicine | Admitting: Internal Medicine

## 2024-01-21 DIAGNOSIS — F419 Anxiety disorder, unspecified: Secondary | ICD-10-CM | POA: Insufficient documentation

## 2024-01-21 DIAGNOSIS — I200 Unstable angina: Secondary | ICD-10-CM | POA: Diagnosis not present

## 2024-01-21 DIAGNOSIS — K219 Gastro-esophageal reflux disease without esophagitis: Secondary | ICD-10-CM | POA: Diagnosis not present

## 2024-01-21 DIAGNOSIS — R9439 Abnormal result of other cardiovascular function study: Secondary | ICD-10-CM | POA: Diagnosis not present

## 2024-01-21 DIAGNOSIS — I25110 Atherosclerotic heart disease of native coronary artery with unstable angina pectoris: Secondary | ICD-10-CM | POA: Diagnosis not present

## 2024-01-21 DIAGNOSIS — E785 Hyperlipidemia, unspecified: Secondary | ICD-10-CM | POA: Insufficient documentation

## 2024-01-21 DIAGNOSIS — I959 Hypotension, unspecified: Secondary | ICD-10-CM | POA: Diagnosis not present

## 2024-01-21 DIAGNOSIS — F32A Depression, unspecified: Secondary | ICD-10-CM | POA: Diagnosis not present

## 2024-01-21 DIAGNOSIS — Z79899 Other long term (current) drug therapy: Secondary | ICD-10-CM | POA: Insufficient documentation

## 2024-01-21 DIAGNOSIS — R079 Chest pain, unspecified: Secondary | ICD-10-CM | POA: Diagnosis not present

## 2024-01-21 DIAGNOSIS — Z743 Need for continuous supervision: Secondary | ICD-10-CM | POA: Diagnosis not present

## 2024-01-21 HISTORY — DX: Chest pain, unspecified: R07.9

## 2024-01-21 HISTORY — PX: LEFT HEART CATH AND CORONARY ANGIOGRAPHY: CATH118249

## 2024-01-21 HISTORY — DX: Hyperlipidemia, unspecified: E78.5

## 2024-01-21 SURGERY — LEFT HEART CATH AND CORONARY ANGIOGRAPHY
Anesthesia: LOCAL

## 2024-01-21 MED ORDER — LIDOCAINE HCL (PF) 1 % IJ SOLN
INTRAMUSCULAR | Status: AC
  Filled 2024-01-21: qty 30

## 2024-01-21 MED ORDER — ASPIRIN 81 MG PO CHEW
CHEWABLE_TABLET | ORAL | Status: AC
  Filled 2024-01-21: qty 1

## 2024-01-21 MED ORDER — HEPARIN SODIUM (PORCINE) 1000 UNIT/ML IJ SOLN
INTRAMUSCULAR | Status: DC | PRN
  Administered 2024-01-21: 3000 [IU] via INTRAVENOUS

## 2024-01-21 MED ORDER — ASPIRIN 81 MG PO CHEW
81.0000 mg | CHEWABLE_TABLET | ORAL | Status: AC
  Administered 2024-01-21: 81 mg via ORAL

## 2024-01-21 MED ORDER — COQ10 100 MG PO CAPS: 100.0000 mg | ORAL_CAPSULE | Freq: Every day | ORAL | Status: DC

## 2024-01-21 MED ORDER — VERAPAMIL HCL 2.5 MG/ML IV SOLN
INTRAVENOUS | Status: DC | PRN
  Administered 2024-01-21: 10 mL via INTRA_ARTERIAL

## 2024-01-21 MED ORDER — MIDAZOLAM HCL 2 MG/2ML IJ SOLN
INTRAMUSCULAR | Status: AC
  Filled 2024-01-21: qty 2

## 2024-01-21 MED ORDER — ESTRADIOL 0.5 MG PO TABS: 0.5000 mg | ORAL_TABLET | Freq: Every day | ORAL | Status: DC

## 2024-01-21 MED ORDER — FENTANYL CITRATE (PF) 100 MCG/2ML IJ SOLN
INTRAMUSCULAR | Status: DC | PRN
  Administered 2024-01-21: 25 ug via INTRAVENOUS

## 2024-01-21 MED ORDER — MIDAZOLAM HCL (PF) 2 MG/2ML IJ SOLN
INTRAMUSCULAR | Status: DC | PRN
  Administered 2024-01-21: 1 mg via INTRAVENOUS

## 2024-01-21 MED ORDER — ALPRAZOLAM 0.25 MG PO TABS: 0.1250 mg | ORAL_TABLET | Freq: Two times a day (BID) | ORAL | Status: DC | PRN

## 2024-01-21 MED ORDER — SODIUM CHLORIDE 0.9% FLUSH: 3.0000 mL | Freq: Two times a day (BID) | INTRAVENOUS | Status: DC

## 2024-01-21 MED ORDER — PANTOPRAZOLE SODIUM 40 MG PO TBEC: 40.0000 mg | DELAYED_RELEASE_TABLET | Freq: Every day | ORAL | Status: DC | PRN

## 2024-01-21 MED ORDER — FENTANYL CITRATE (PF) 100 MCG/2ML IJ SOLN
INTRAMUSCULAR | Status: AC
  Filled 2024-01-21: qty 2

## 2024-01-21 MED ORDER — IOHEXOL 350 MG/ML SOLN
INTRAVENOUS | Status: DC | PRN
  Administered 2024-01-21: 35 mL

## 2024-01-21 MED ORDER — LIDOCAINE HCL (PF) 1 % IJ SOLN
INTRAMUSCULAR | Status: DC | PRN
  Administered 2024-01-21: 2 mL via INTRADERMAL

## 2024-01-21 MED ORDER — HEPARIN SODIUM (PORCINE) 1000 UNIT/ML IJ SOLN
INTRAMUSCULAR | Status: AC
  Filled 2024-01-21: qty 10

## 2024-01-21 MED ORDER — SODIUM CHLORIDE 0.9% FLUSH: 3.0000 mL | INTRAVENOUS | Status: DC | PRN

## 2024-01-21 MED ORDER — HEPARIN (PORCINE) IN NACL 1000-0.9 UT/500ML-% IV SOLN
INTRAVENOUS | Status: DC | PRN
  Administered 2024-01-21 (×2): 500 mL

## 2024-01-21 MED ORDER — ONDANSETRON HCL 4 MG/2ML IJ SOLN: 4.0000 mg | Freq: Four times a day (QID) | INTRAMUSCULAR | Status: DC | PRN

## 2024-01-21 MED ORDER — VITAMIN D 50 MCG (2000 UT) PO TABS: 2000.0000 [IU] | ORAL_TABLET | ORAL | Status: DC

## 2024-01-21 MED ORDER — CARBOXYMETHYLCELL-GLYCERIN PF 0.5-1 % OP SOLN: 1.0000 [drp] | Freq: Every day | OPHTHALMIC | Status: DC | PRN

## 2024-01-21 MED ORDER — ASPIRIN 81 MG PO TBEC: 81.0000 mg | DELAYED_RELEASE_TABLET | ORAL | Status: DC

## 2024-01-21 MED ORDER — VERAPAMIL HCL 2.5 MG/ML IV SOLN
INTRAVENOUS | Status: AC
  Filled 2024-01-21: qty 2

## 2024-01-21 MED ORDER — SODIUM CHLORIDE 0.9 % IV SOLN: 250.0000 mL | INTRAVENOUS | Status: DC | PRN

## 2024-01-21 MED ORDER — L-LYSINE 500 MG PO CAPS: 500.0000 mg | ORAL_CAPSULE | ORAL | Status: DC

## 2024-01-21 MED ORDER — MECLIZINE HCL 12.5 MG PO TABS: 12.5000 mg | ORAL_TABLET | Freq: Three times a day (TID) | ORAL | Status: DC | PRN

## 2024-01-21 MED ORDER — FREE WATER: 500.0000 mL | Freq: Once | Status: DC

## 2024-01-21 MED ORDER — FREE WATER
500.0000 mL | Freq: Once | Status: AC
  Administered 2024-01-21: 500 mL via ORAL

## 2024-01-21 MED ORDER — ACETAMINOPHEN 325 MG PO TABS: 650.0000 mg | ORAL_TABLET | ORAL | Status: DC | PRN

## 2024-01-21 SURGICAL SUPPLY — 7 items
CATH INFINITI 5FR ANG PIGTAIL (CATHETERS) IMPLANT
CATH INFINITI AMBI 5FR JK (CATHETERS) IMPLANT
DEVICE RAD TR BAND REGULAR (VASCULAR PRODUCTS) IMPLANT
GLIDESHEATH SLEND SS 6F .021 (SHEATH) IMPLANT
GUIDEWIRE INQWIRE 1.5J.035X260 (WIRE) IMPLANT
PACK CARDIAC CATHETERIZATION (CUSTOM PROCEDURE TRAY) ×2 IMPLANT
SET ATX-X65L (MISCELLANEOUS) IMPLANT

## 2024-01-21 NOTE — Progress Notes (Signed)
Report to Jill, RN

## 2024-01-21 NOTE — H&P (Signed)
 Cardiology Admission History and Physical   Patient ID: Joy Fernandez MRN: [id number]; DOB: 10-02-1945   Admission date: 01/21/2024  PCP:  Joy Fitch, MD   [facility] Providers Cardiologist:  Joy JONELLE Crape, MD     Chief Complaint:  Chest pain  Patient Profile: Joy [facility]andez is a 78 y.o. female with PMH GERD, anxiety, depression, hyperlipidemia who is being seen 01/21/2024 for the evaluation of chest pain.  History of Present Illness: Joy Fernandez is a 78 yo female with no significant PMH who presented to [facility] with complaints of chest pain.  She reported waking from sleep around 2 AM the morning of admission on 10/21 with substernal chest pressure.   Work up at [employer] Sodium 138, potassium 4.2, creatinine 0.7, high-sensitivity troponin negative x 3, WBC 14.3, hemoglobin 16.5, LDL 66, HDL 57.  Chest x-ray negative EKG with sinus rhythm, first-degree AV block  Underwent Lexiscan Myoview which showed small area of mild ischemia in the basal mid portion of the inferior wall, LVEF of 74%.  Case was discussed with [facility] with recommendations to transfer to [facility] for further evaluation with cardiac catheterization.  Past Medical History:  Diagnosis Date   Abnormal vaginal bleeding 05/30/2014   Actinic keratosis 10/20/2022   Age-related osteoporosis without current pathological fracture 05/30/2016   DEXA 12/25/2015 T score -3.3     Anxiety    Arthritis    Blood dyscrasia    low platelets - no longer followed by hematology   Cancer (HCC)    hx of skin cancers   Complication of anesthesia    Depression    Family history of adverse reaction to anesthesia    son - when young had surgery and woke up and could not move could not break down the chemical and your husband tested positive for same condition son has had surgery since and done ok husband has done okwith surgeries   Family history of skin cancer 04/23/2021   GERD (gastroesophageal reflux  disease)    High risk medication use 05/30/2016   Methotrexate       History of depression 06/06/2016   History of gastroesophageal reflux (GERD) 06/06/2016   History of malignant neoplasm of skin 04/23/2021   History of recurrent UTIs 03/27/2021   Hyperparathyroidism, primary 08/28/2021   Idiopathic thrombocytopenic purpura (HCC) 02/24/2011   Insomnia    Ischemic cerebrovascular disease    Lipoma of abdominal wall 08/01/2015   Osteoporosis    Polycythemia, secondary 11/14/2020   PONV (postoperative nausea and vomiting)    Primary osteoarthritis of both feet 05/30/2016   Primary osteoarthritis of both hands 05/30/2016   Rheumatoid arthritis (HCC)    Rheumatoid arthritis of multiple sites with negative rheumatoid factor (HCC) 05/30/2016   Status post parathyroidectomy 09/12/2021   Thrombocytopenia    hx of   Vaginal atrophy 10/05/2014   Past Surgical History:  Procedure Laterality Date   ABDOMINAL HYSTERECTOMY     BREAST SURGERY Left    lumpectomy    CHOLECYSTECTOMY     MOHS SURGERY     x4   PARATHYROIDECTOMY Right 08/29/2021   Procedure: RIGHT INFERIOR PARATHYROIDECTOMY;  Surgeon: Joy Boas, MD;  Location: [facility];  Service: General;  Laterality: Right;   TUBAL LIGATION       Medications Prior to Admission: Prior to Admission medications   Medication Sig Start Date End Date Taking? Authorizing Provider  acetaminophen  (TYLENOL ) 500 MG tablet Take 500 mg by mouth every 8 (eight) hours  as needed for moderate pain.    [provider]  ALPRAZolam (XANAX) 0.25 MG tablet Take 0.125-0.25 mg by mouth 2 (two) times daily as needed for anxiety. 06/29/19   [provider]  aspirin EC 81 MG tablet Take 81 mg by mouth 2 (two) times a week. Swallow whole.    [provider]  Carboxymethylcell-Glycerin PF (REFRESH RELIEVA PF) 0.5-1 % SOLN Place 1 drop into both eyes daily as needed (dry eyes).    [provider]  Cholecalciferol (VITAMIN D ) 50 MCG (2000  UT) tablet Take 2,000 Units by mouth 3 (three) times a week.    [provider]  Coenzyme Q10 (COQ10) 100 MG CAPS Take 100 mg by mouth daily.    [provider]  estradiol (ESTRACE) 0.5 MG tablet Take 0.5 mg by mouth daily. 07/14/22   [provider]  L-Lysine 500 MG CAPS Take 500 mg by mouth 2 (two) times a week.    [provider]  meclizine  (ANTIVERT ) 12.5 MG tablet Take 1 tablet (12.5 mg total) by mouth 3 (three) times daily as needed for dizziness. 02/25/23   Joy Dorna HERO, FNP  pantoprazole (PROTONIX) 40 MG tablet Take 40 mg by mouth daily as needed (acid reflux). 01/13/19   [provider]  rosuvastatin (CRESTOR) 5 MG tablet Take 5 mg by mouth daily. 07/31/21   [provider]  VITAMIN A PO Take 1 tablet by mouth every other day.    [provider]  Wheat Dextrin (BENEFIBER) POWD Take 2 Scoops by mouth daily.    [provider]  zolpidem (AMBIEN) 10 MG tablet Take 5-10 mg by mouth at bedtime. 06/29/19   [provider]     Allergies:    Allergies  Allergen Reactions   Other Hives and Swelling    Synthetic codeine,given in liquid form for cough.   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  Tobacco Use   Smoking status: Never    Passive exposure: Past   Smokeless tobacco: Never  Vaping Use   Vaping status: Never Used  Substance and Sexual Activity   Alcohol  use: No   Drug use: No   Sexual activity: Not on file  Other Topics Concern   Not on file  Social History Narrative   Not on file   Social Drivers of Health   Financial Resource Strain: Not on file  Food Insecurity: Not on file  Transportation Needs: Not on file  Physical Activity:  Not on file  Stress: Not on file  Social Connections: Not on file  Intimate Partner Violence: Not on file     Family History:   The patient's family history includes Depression in her daughter and son; Heart disease in her father; Hypertension in her brother and mother; Lung cancer in her mother.    ROS:  Please see the history of present illness.  All other ROS reviewed and negative.     Physical Exam/Data: Vitals:   01/21/24 1130 01/21/24 1330  BP: 125/69   Pulse: 78   Resp: 17   SpO2: 96% 100%  Weight: 64.4 kg   Height: 5' 3 (1.6 m)  Intake/Output Summary (Last 24 hours) at 01/21/2024 1345 Last data filed at 01/21/2024 1219 Gross per 24 hour  Intake 500 ml  Output --  Net 500 ml      01/21/2024   11:30 AM 10/22/2022    1:44 PM 07/22/2022    2:52 PM  Last 3 Weights  Weight (lbs) 142 lb 143 lb 138 lb 9.6 oz  Weight (kg) 64.411 kg 64.864 kg 62.869 kg     Body mass index is 25.15 kg/m.  General:  Well nourished, well developed, in no acute distress HEENT: normal Neck: no JVD Vascular: No carotid bruits; Distal pulses 2+ bilaterally   Cardiac:  normal S1, S2; RRR; no murmur  Lungs:  clear to auscultation bilaterally, no wheezing, rhonchi or rales  Abd: soft, nontender, no hepatomegaly  Ext: no edema Musculoskeletal:  No deformities, BUE and BLE strength normal and equal Skin: warm and dry  Neuro: no focal abnormalities noted Psych:  Normal affect   Relevant CV Studies:  Stress test: see chart   Laboratory Data: High Sensitivity Troponin:  No results for input(s): TROPONINIHS in the last 720 hours.    ChemistryNo results for input(s): NA, K, CL, CO2, GLUCOSE, BUN, CREATININE, CALCIUM, MG, GFRNONAA, GFRAA, ANIONGAP in the last 168 hours.  No results for input(s): PROT, ALBUMIN, AST, ALT, ALKPHOS, BILITOT in the last 168 hours. Lipids No results for input(s): CHOL, TRIG, HDL, LABVLDL, LDLCALC, CHOLHDL in the  last 168 hours. HematologyNo results for input(s): WBC, RBC, HGB, HCT, MCV, MCH, MCHC, RDW, PLT in the last 168 hours. Thyroid  No results for input(s): TSH, FREET4 in the last 168 hours. BNPNo results for input(s): BNP, PROBNP in the last 168 hours.  DDimer No results for input(s): DDIMER in the last 168 hours.  Radiology/Studies:  No results found.   Assessment and Plan:  Joy [facility]andez is a 78 y.o. female with PMH GERD, anxiety, depression, hyperlipidemia who is being seen 01/21/2024 for the evaluation of chest pain.  Chest pain -- Presented to [facility] on 10/21 with complaints of substernal chest pain and shortness of breath.  High-sensitivity troponins there were negative x 3 and EKG nonischemic.  Underwent Lexiscan Myoview which was notable for small area of mild ischemia in the basal mid portion of the inferior wall, LVEF of 74%.  Therefore transferred to [facility] for further evaluation with cardiac catheterization. -- Further recommendations pending cardiac catheterization  GERD -- On Protonix PTA  Hyperlipidemia -- LDL 66, HDL 57 -- Crestor 5 mg daily    Code Status: Full Code  Severity of Illness: The appropriate patient status for this patient is INPATIENT. Inpatient status is judged to be reasonable and necessary in order to provide the required intensity of service to ensure the patient's safety. The patient's presenting symptoms, physical exam findings, and initial radiographic and laboratory data in the context of their chronic comorbidities is felt to place them at high risk for further clinical deterioration. Furthermore, it is not anticipated that the patient will be medically stable for discharge from the [facility] within 2 midnights of admission.   * I certify that at the point of admission it is my clinical judgment that the patient will require inpatient [facility] care spanning beyond 2 midnights from the point of admission due to  high intensity of service, high risk for further deterioration and high frequency of surveillance required.*  For questions or updates, please contact [facility] Please consult [email or web address] for contact info under  Signed, Manuelita Rummer, NP  01/21/2024 1:45 PM

## 2024-01-21 NOTE — Discharge Instructions (Signed)

## 2024-01-21 NOTE — Progress Notes (Signed)
 TR Band removed, no s/s of complications at incision site. PT ambulated in the hallway to the bathroom x 2 during this visit. Was able to void without difficulty. Discharge instructions reviewed with pt and daughter Burnard at the bedside denies questions or concerns. PT escorted from the unit to personal vehicle via wheel chair.

## 2024-01-21 NOTE — Discharge Summary (Signed)
 Discharge Summary   Patient ID: Joy Fernandez MRN: [id number]; DOB: [birth year]  Admit date: 01/21/2024 Discharge date: 01/21/2024  PCP:  Jefferey Fitch, MD   [facility] Providers Cardiologist:  Jennifer JONELLE Crape, MD     Discharge Diagnoses  Principal Problem:   Chest pain Active Problems:   Hyperlipidemia  Diagnostic Studies/Procedures   Cath: 01/21/2024    Prox LAD to Mid LAD lesion is 20% stenosed.   The left ventricular systolic function is normal.   LV end diastolic pressure is mildly elevated.   The left ventricular ejection fraction is 55-65% by visual estimate.   1.  Minimal nonobstructive coronary artery disease. 2.  Normal LV systolic function mildly elevated left ventricular end-diastolic pressure.   Recommendations: False-positive nuclear stress test. Recommend medical therapy. _____________   History of Present Illness   Joy Fernandez is a 78 y.o. female with PMH GERD, anxiety, depression, hyperlipidemia who was seen 01/21/2024 for the evaluation of chest pain. She reported waking from sleep around 2 AM the morning of admission on 10/21 with substernal chest pressure.    Work up at [employer] Sodium 138, potassium 4.2, creatinine 0.7, high-sensitivity troponin negative x 3, WBC 14.3, hemoglobin 16.5, LDL 66, HDL 57.  Chest x-ray negative EKG with sinus rhythm, first-degree AV block   Underwent Lexiscan Myoview which showed small area of mild ischemia in the basal mid portion of the inferior wall, LVEF of 74%.  Case was discussed with Dr. Revankar with recommendations to transfer to [facility] for further evaluation with cardiac catheterization.  Hospital Course    Chest pain -- Presented to [facility] on 10/21 with complaints of substernal chest pain and shortness of breath.  High-sensitivity troponins there were negative x 3 and EKG nonischemic.  Underwent Lexiscan Myoview which was notable for small area of mild ischemia in the basal mid  portion of the inferior wall, LVEF of 74%.  Therefore transferred to [facility] for further evaluation with cardiac catheterization. -- Underwent cardiac cath noted above with pLAD to mLAD 20% lesion, no culprit for symptoms. Recommendations for medical therapy   GERD -- On Protonix PTA   Hyperlipidemia -- LDL 66, HDL 57 -- Crestor 5 mg daily   Patient was seen by Dr. Darron and deemed stable for discharge home. Follow up arranged in the office.   Did the patient have an acute coronary syndrome (MI, NSTEMI, STEMI, etc) this admission?:  No                               Did the patient have a percutaneous coronary intervention (stent / angioplasty)?:  No.    _____________  Discharge Vitals Blood pressure (!) 148/80, pulse 69, resp. rate 17, height 5' 3 (1.6 m), weight 64.4 kg, SpO2 99%.  Filed Weights   01/21/24 1130  Weight: 64.4 kg    Labs & Radiologic Studies  CBC No results for input(s): WBC, NEUTROABS, HGB, HCT, MCV, PLT in the last 72 hours. Basic Metabolic Panel No results for input(s): NA, K, CL, CO2, GLUCOSE, BUN, CREATININE, CALCIUM, MG, PHOS in the last 72 hours. Liver Function Tests No results for input(s): AST, ALT, ALKPHOS, BILITOT, PROT, ALBUMIN in the last 72 hours. No results for input(s): LIPASE, AMYLASE in the last 72 hours. High Sensitivity Troponin:   No results for input(s): TROPONINIHS in the last 720 hours.  No results for input(s): TRNPT in the last 720  hours.  BNP Invalid input(s): POCBNP No results for input(s): PROBNP in the last 72 hours.  No results for input(s): BNP in the last 72 hours.  D-Dimer No results for input(s): DDIMER in the last 72 hours. Hemoglobin A1C No results for input(s): HGBA1C in the last 72 hours. Fasting Lipid Panel No results for input(s): CHOL, HDL, LDLCALC, TRIG, CHOLHDL, LDLDIRECT in the last 72 hours. No results found for: LIPOA  Thyroid   Function Tests No results for input(s): TSH, T4TOTAL, T3FREE, THYROIDAB in the last 72 hours.  Invalid input(s): FREET3 _____________  CARDIAC CATHETERIZATION Result Date: 01/21/2024   Prox LAD to Mid LAD lesion is 20% stenosed.   The left ventricular systolic function is normal.   LV end diastolic pressure is mildly elevated.   The left ventricular ejection fraction is 55-65% by visual estimate. 1.  Minimal nonobstructive coronary artery disease. 2.  Normal LV systolic function mildly elevated left ventricular end-diastolic pressure. Recommendations: False-positive nuclear stress test. Recommend medical therapy.    Disposition Pt is being discharged home today in good condition.  Follow-up Plans & Appointments   Discharge Medications Allergies as of 01/21/2024       Reactions   Other Hives, Swelling   Synthetic codeine,given in liquid form for cough.   Codeine Rash        Medication List     STOP taking these medications    aspirin EC 81 MG tablet   meclizine  12.5 MG tablet Commonly known as: ANTIVERT        TAKE these medications    acetaminophen  500 MG tablet Commonly known as: TYLENOL  Take 500 mg by mouth every 8 (eight) hours as needed for moderate pain.   ALPRAZolam 0.25 MG tablet Commonly known as: XANAX Take 0.125-0.25 mg by mouth 2 (two) times daily as needed for anxiety.   Benefiber Powd Take 2 Scoops by mouth daily.   CoQ10 100 MG Caps Take 100 mg by mouth daily.   estradiol 0.5 MG tablet Commonly known as: ESTRACE Take 0.5 mg by mouth daily.   L-Lysine 500 MG Caps Take 500 mg by mouth 2 (two) times a week.   pantoprazole 40 MG tablet Commonly known as: PROTONIX Take 40 mg by mouth daily as needed (acid reflux).   Refresh Relieva PF 0.5-1 % Soln Generic drug: Carboxymethylcell-Glycerin PF Place 1 drop into both eyes daily as needed (dry eyes).   rosuvastatin 5 MG tablet Commonly known as: CRESTOR Take 5 mg by mouth daily.    VITAMIN A PO Take 1 tablet by mouth every other day.   Vitamin D  50 MCG (2000 UT) tablet Take 2,000 Units by mouth 3 (three) times a week.   zolpidem 10 MG tablet Commonly known as: AMBIEN Take 5-10 mg by mouth at bedtime.         Outstanding Labs/Studies  N/a   Duration of Discharge Encounter: APP Time: 15 minutes   Signed, Manuelita Rummer, NP 01/21/2024, 2:28 PM

## 2024-01-22 ENCOUNTER — Encounter (HOSPITAL_COMMUNITY): Payer: Self-pay | Admitting: Cardiovascular Disease

## 2024-01-27 DIAGNOSIS — M059 Rheumatoid arthritis with rheumatoid factor, unspecified: Secondary | ICD-10-CM | POA: Diagnosis not present

## 2024-01-27 DIAGNOSIS — K219 Gastro-esophageal reflux disease without esophagitis: Secondary | ICD-10-CM | POA: Diagnosis not present

## 2024-01-27 DIAGNOSIS — E785 Hyperlipidemia, unspecified: Secondary | ICD-10-CM | POA: Diagnosis not present

## 2024-01-27 DIAGNOSIS — Z6826 Body mass index (BMI) 26.0-26.9, adult: Secondary | ICD-10-CM | POA: Diagnosis not present

## 2024-01-27 DIAGNOSIS — R1310 Dysphagia, unspecified: Secondary | ICD-10-CM | POA: Diagnosis not present

## 2024-01-27 DIAGNOSIS — D696 Thrombocytopenia, unspecified: Secondary | ICD-10-CM | POA: Diagnosis not present

## 2024-01-27 DIAGNOSIS — R079 Chest pain, unspecified: Secondary | ICD-10-CM | POA: Diagnosis not present

## 2024-02-09 ENCOUNTER — Ambulatory Visit: Attending: Cardiology | Admitting: Cardiology

## 2024-02-09 ENCOUNTER — Encounter: Payer: Self-pay | Admitting: Cardiology

## 2024-02-09 VITALS — BP 134/78 | HR 66 | Ht 63.0 in | Wt 146.6 lb

## 2024-02-09 DIAGNOSIS — I2510 Atherosclerotic heart disease of native coronary artery without angina pectoris: Secondary | ICD-10-CM | POA: Insufficient documentation

## 2024-02-09 DIAGNOSIS — E782 Mixed hyperlipidemia: Secondary | ICD-10-CM | POA: Insufficient documentation

## 2024-02-09 NOTE — Progress Notes (Signed)
 Cardiology Office Note:    Date:  02/09/2024   ID:  [name], Corona 03-Apr-1945, MRN [id number]  PCP:  Jefferey Fitch, MD  Cardiologist:  Jennifer JONELLE Crape, MD   Referring MD: Jefferey Fitch, MD    ASSESSMENT:    1. Mild CAD   2. Mixed dyslipidemia    PLAN:    In order of problems listed above:  Mild coronary artery disease: Secondary prevention stressed with the patient.  Importance of compliance with diet medication stressed and patient verbalized standing.  She was advised to walk at least half an hour a day on a daily basis. Mixed dyslipidemia: On lipid-lowering medications followed by primary care.  With above findings goal LDL must be less than 60.  I discussed this with her and questions were answered to her satisfaction. She will see Dr. Monetta in follow-up appointment in 9 months or earlier if she has any concerns.  Coronary angiography report was discussed with her at length.   Medication Adjustments/Labs and Tests Ordered: Current medicines are reviewed at length with the patient today.  Concerns regarding medicines are outlined above.  No orders of the defined types were placed in this encounter.  No orders of the defined types were placed in this encounter.    No chief complaint on file.    History of Present Illness:    Joy Fernandez is a 78 y.o. female.  Patient has past medical history of recently diagnosed very mild coronary artery disease.  She has mixed dyslipidemia.  She denies any problems at this time and takes care of activities of daily living.  No chest pain orthopnea or PND.  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  At the time of my evaluation, the patient is alert awake oriented and in no distress.  Past Medical History:  Diagnosis Date   Abnormal vaginal bleeding 05/30/2014   Actinic keratosis 10/20/2022   Age-related osteoporosis without current pathological fracture 05/30/2016   DEXA 12/25/2015 T  score -3.3     Anxiety    Arthritis    Blood dyscrasia    low platelets - no longer followed by hematology   Cancer [facility])    hx of skin cancers   Chest pain 01/21/2024   Complication of anesthesia    Depression    Family history of adverse reaction to anesthesia    son - when young had surgery and woke up and could not move could not break down the chemical and your husband tested positive for same condition son has had surgery since and done ok husband has done okwith surgeries   Family history of skin cancer 04/23/2021   GERD (gastroesophageal reflux disease)    High risk medication use 05/30/2016   Methotrexate       History of depression 06/06/2016   History of gastroesophageal reflux (GERD) 06/06/2016   History of malignant neoplasm of skin 04/23/2021   History of recurrent UTIs 03/27/2021   Hyperlipidemia 01/21/2024   Hyperparathyroidism, primary 08/28/2021   Idiopathic thrombocytopenic purpura (HCC) 02/24/2011   Insomnia    Ischemic cerebrovascular disease    Lipoma of abdominal wall 08/01/2015   Osteoporosis    Polycythemia, secondary 11/14/2020   PONV (postoperative nausea and vomiting)    Postoperative examination 09/19/2015   Primary osteoarthritis of both feet 05/30/2016   Primary osteoarthritis of both hands 05/30/2016   Rheumatoid arthritis (HCC)    Rheumatoid arthritis of multiple sites with negative rheumatoid factor (  HCC) 05/30/2016   Status post parathyroidectomy 09/12/2021   Thrombocytopenia    hx of   Vaginal atrophy 10/05/2014    Past Surgical History:  Procedure Laterality Date   ABDOMINAL HYSTERECTOMY     BREAST SURGERY Left    lumpectomy    CHOLECYSTECTOMY     LEFT HEART CATH AND CORONARY ANGIOGRAPHY N/A 01/21/2024   Procedure: LEFT HEART CATH AND CORONARY ANGIOGRAPHY;  Surgeon: Darron Deatrice LABOR, MD;  Location: [facility] INVASIVE [facility];  Service: Cardiovascular;  Laterality: N/A;   MOHS SURGERY     x4   PARATHYROIDECTOMY Right 08/29/2021   Procedure:  RIGHT INFERIOR PARATHYROIDECTOMY;  Surgeon: Eletha Boas, MD;  Location: [facility] ORS;  Service: General;  Laterality: Right;   TUBAL LIGATION      Current Medications: Current Meds  Medication Sig   acetaminophen  (TYLENOL ) 500 MG tablet Take 500 mg by mouth every 8 (eight) hours as needed for moderate pain.   ALPRAZolam (XANAX) 0.25 MG tablet Take 0.125-0.25 mg by mouth 2 (two) times daily as needed for anxiety.   Carboxymethylcell-Glycerin PF (REFRESH RELIEVA PF) 0.5-1 % SOLN Place 1 drop into both eyes daily as needed (dry eyes).   Cholecalciferol (VITAMIN D ) 50 MCG (2000 UT) tablet Take 2,000 Units by mouth 3 (three) times a week.   Coenzyme Q10 (COQ10) 100 MG CAPS Take 100 mg by mouth daily.   L-Lysine 500 MG CAPS Take 500 mg by mouth 2 (two) times a week.   pantoprazole (PROTONIX) 40 MG tablet Take 40 mg by mouth daily as needed (acid reflux).   rosuvastatin (CRESTOR) 5 MG tablet Take 5 mg by mouth daily.   zolpidem (AMBIEN) 10 MG tablet Take 5-10 mg by mouth at bedtime.     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  Tobacco Use   Smoking status: Never    Passive exposure: Past   Smokeless tobacco: Never  Vaping Use   Vaping status: Never Used  Substance and Sexual Activity   Alcohol  use: No   Drug use: No   Sexual activity: Not on file  Other Topics Concern   Not on file  Social History Narrative   Not on file   Social Drivers of Health   Financial Resource Strain: Not on file  Food Insecurity: Not on file  Transportation Needs: Not on file  Physical Activity: Not on file  Stress: Not on file  Social Connections: Not on file     Family History: The patient's family history includes Depression in her daughter and son;  Heart disease in her father; Hypertension in her brother and mother; Lung cancer in her mother.  ROS:   Please see the history of present illness.    All other systems reviewed and are negative.  EKGs/Labs/Other Studies Reviewed:    The following studies were reviewed today: .SABRA   I discussed my findings with the patient at length   Recent Labs: No results found for requested labs within last 365 days.  Recent Lipid Panel No results found for: CHOL, TRIG, HDL, CHOLHDL, VLDL, LDLCALC, LDLDIRECT  Physical Exam:    VS:  BP (!) 158/90   Pulse 66   Ht 5' 3 (  1.6 m)   Wt 146 lb 9.6 oz (66.5 kg)   SpO2 97%   BMI 25.97 kg/m     Wt Readings from Last 3 Encounters:  02/09/24 146 lb 9.6 oz (66.5 kg)  01/21/24 142 lb (64.4 kg)  10/22/22 143 lb (64.9 kg)     GEN: Patient is in no acute distress HEENT: Normal NECK: No JVD; No carotid bruits LYMPHATICS: No lymphadenopathy CARDIAC: Hear sounds regular, 2/6 systolic murmur at the apex. RESPIRATORY:  Clear to auscultation without rales, wheezing or rhonchi  ABDOMEN: Soft, non-tender, non-distended MUSCULOSKELETAL:  No edema; No deformity  SKIN: Warm and dry NEUROLOGIC:  Alert and oriented x 3 PSYCHIATRIC:  Normal affect   Signed, Jennifer JONELLE Crape, MD  02/09/2024 4:31 PM    [facility]

## 2024-02-09 NOTE — Patient Instructions (Addendum)
 Medication Instructions:  Your physician recommends that you continue on your current medications as directed. Please refer to the Current Medication list given to you today.  *If you need a refill on your cardiac medications before your next appointment, please call your pharmacy*   Lab Work: None Ordered If you have labs (blood work) drawn today and your tests are completely normal, you will receive your results only by: MyChart Message (if you have MyChart) OR A paper copy in the mail If you have any lab test that is abnormal or we need to change your treatment, we will call you to review the results.   Testing/Procedures: None Ordered   Follow-Up: At [facility], you and your health needs are our priority.  As part of our continuing mission to provide you with exceptional heart care, we have created designated Provider Care Teams.  These Care Teams include your primary Cardiologist (physician) and Advanced Practice Providers (APPs -  Physician Assistants and Nurse Practitioners) who all work together to provide you with the care you need, when you need it.  We recommend signing up for the patient portal called MyChart.  Sign up information is provided on this After Visit Summary.  MyChart is used to connect with patients for Virtual Visits (Telemedicine).  Patients are able to view lab/test results, encounter notes, upcoming appointments, etc.  Non-urgent messages can be sent to your provider as well.   To learn more about what you can do with MyChart, go to [email or web address].    Your next appointment:   12 month(s)  The format for your next appointment:   In Person  Provider:   Jennifer Crape, MD   Other Instructions NA         Please keep a BP log for 2 weeks and send by MyChart or mail.                      Dr. Crape [address] [place], [place] [address]  Blood Pressure Record Sheet To take your blood pressure, you will need a blood pressure machine.  You can buy a blood pressure machine (blood pressure monitor) at your clinic, drug store, or online. When choosing one, consider: An automatic monitor that has an arm cuff. A cuff that wraps snugly around your upper arm. You should be able to fit only one finger between your arm and the cuff. A device that stores blood pressure reading results. Do not choose a monitor that measures your blood pressure from your wrist or finger. Follow your health care provider's instructions for how to take your blood pressure. To use this form: Get one reading in the morning (a.m.) 1-2 hours after you take any medicines. Get one reading in the evening (p.m.) before supper.   Blood pressure log Date: _______________________  a.m. _____________________(1st reading) HR___________            p.m. _____________________(2nd reading) HR__________  Date: _______________________  a.m. _____________________(1st reading) HR___________            p.m. _____________________(2nd reading) HR__________  Date: _______________________  a.m. _____________________(1st reading) HR___________            p.m. _____________________(2nd reading) HR__________  Date: _______________________  a.m. _____________________(1st reading) HR___________            p.m. _____________________(2nd reading) HR__________  Date: _______________________  a.m. _____________________(1st reading) HR___________            p.m. _____________________(2nd reading) HR__________  Date: _______________________  a.m. _____________________(1st reading) HR___________            p.m. _____________________(2nd reading) HR__________  Date: _______________________  a.m. _____________________(1st reading) HR___________            p.m. _____________________(2nd reading) HR__________   This information is not intended to replace advice given to you by your health care provider. Make sure you discuss any questions you have with your health  care provider. Document Revised: 07/06/2019 Document Reviewed: 07/06/2019 Elsevier Patient Education  2021 [employer].
